# Patient Record
Sex: Male | Born: 1965
Health system: Southern US, Community
[De-identification: ages and names within clinical notes are randomized; demographics above are authoritative.]

## PROBLEM LIST (undated history)

## (undated) DIAGNOSIS — D75A Glucose-6-phosphate dehydrogenase (G6PD) deficiency without anemia: Secondary | ICD-10-CM

## (undated) HISTORY — PX: NECK SURGERY: SHX720

---

## 1998-08-13 ENCOUNTER — Emergency Department (HOSPITAL_COMMUNITY): Admission: EM | Admit: 1998-08-13 | Discharge: 1998-08-13 | Payer: Self-pay | Admitting: Emergency Medicine

## 1999-09-17 ENCOUNTER — Emergency Department (HOSPITAL_COMMUNITY): Admission: EM | Admit: 1999-09-17 | Discharge: 1999-09-17 | Payer: Self-pay | Admitting: Emergency Medicine

## 1999-09-20 ENCOUNTER — Ambulatory Visit (HOSPITAL_COMMUNITY): Admission: RE | Admit: 1999-09-20 | Discharge: 1999-09-20 | Payer: Self-pay | Admitting: Family Medicine

## 1999-09-20 ENCOUNTER — Encounter: Payer: Self-pay | Admitting: Family Medicine

## 1999-10-15 ENCOUNTER — Emergency Department (HOSPITAL_COMMUNITY): Admission: EM | Admit: 1999-10-15 | Discharge: 1999-10-16 | Payer: Self-pay | Admitting: Emergency Medicine

## 2000-11-09 ENCOUNTER — Encounter: Payer: Self-pay | Admitting: Internal Medicine

## 2000-11-09 ENCOUNTER — Emergency Department (HOSPITAL_COMMUNITY): Admission: EM | Admit: 2000-11-09 | Discharge: 2000-11-09 | Payer: Self-pay | Admitting: Internal Medicine

## 2009-09-29 ENCOUNTER — Emergency Department (HOSPITAL_COMMUNITY): Admission: EM | Admit: 2009-09-29 | Discharge: 2009-09-29 | Payer: Self-pay | Admitting: Emergency Medicine

## 2014-02-17 ENCOUNTER — Emergency Department (HOSPITAL_COMMUNITY): Payer: Self-pay

## 2014-02-17 ENCOUNTER — Emergency Department (HOSPITAL_COMMUNITY)
Admission: EM | Admit: 2014-02-17 | Discharge: 2014-02-17 | Disposition: A | Discharge status: Home / Self Care | Payer: Self-pay | Attending: Emergency Medicine | Admitting: Emergency Medicine

## 2014-02-17 ENCOUNTER — Encounter (HOSPITAL_COMMUNITY): Payer: Self-pay | Admitting: Emergency Medicine

## 2014-02-17 DIAGNOSIS — F17200 Nicotine dependence, unspecified, uncomplicated: Secondary | ICD-10-CM | POA: Insufficient documentation

## 2014-02-17 DIAGNOSIS — R05 Cough: Secondary | ICD-10-CM

## 2014-02-17 DIAGNOSIS — J8410 Pulmonary fibrosis, unspecified: Secondary | ICD-10-CM

## 2014-02-17 DIAGNOSIS — J984 Other disorders of lung: Secondary | ICD-10-CM | POA: Insufficient documentation

## 2014-02-17 DIAGNOSIS — Z862 Personal history of diseases of the blood and blood-forming organs and certain disorders involving the immune mechanism: Secondary | ICD-10-CM | POA: Insufficient documentation

## 2014-02-17 DIAGNOSIS — Z8639 Personal history of other endocrine, nutritional and metabolic disease: Secondary | ICD-10-CM | POA: Insufficient documentation

## 2014-02-17 DIAGNOSIS — R059 Cough, unspecified: Secondary | ICD-10-CM | POA: Insufficient documentation

## 2014-02-17 HISTORY — DX: Glucose-6-phosphate dehydrogenase (G6PD) deficiency without anemia: D75.A

## 2014-02-17 MED ORDER — IPRATROPIUM BROMIDE 0.02 % IN SOLN
0.5000 mg | Freq: Once | RESPIRATORY_TRACT | Status: AC
  Administered 2014-02-17: 0.5 mg via RESPIRATORY_TRACT
  Filled 2014-02-17: qty 2.5

## 2014-02-17 MED ORDER — BENZONATATE 100 MG PO CAPS: 100.0000 mg | ORAL_CAPSULE | Freq: Three times a day (TID) | ORAL | Status: DC

## 2014-02-17 MED ORDER — ALBUTEROL SULFATE HFA 108 (90 BASE) MCG/ACT IN AERS: 1.0000 | INHALATION_SPRAY | Freq: Four times a day (QID) | RESPIRATORY_TRACT | Status: DC | PRN

## 2014-02-17 MED ORDER — BENZONATATE 100 MG PO CAPS
100.0000 mg | ORAL_CAPSULE | Freq: Once | ORAL | Status: AC
  Administered 2014-02-17: 100 mg via ORAL
  Filled 2014-02-17: qty 1

## 2014-02-17 MED ORDER — ALBUTEROL SULFATE (2.5 MG/3ML) 0.083% IN NEBU
5.0000 mg | INHALATION_SOLUTION | Freq: Once | RESPIRATORY_TRACT | Status: AC
  Administered 2014-02-17: 5 mg via RESPIRATORY_TRACT
  Filled 2014-02-17: qty 6

## 2014-02-17 MED ORDER — IBUPROFEN 200 MG PO TABS
600.0000 mg | ORAL_TABLET | Freq: Once | ORAL | Status: AC
  Administered 2014-02-17: 600 mg via ORAL
  Filled 2014-02-17: qty 3

## 2014-02-17 NOTE — ED Provider Notes (Signed)
CSN: [id number]     Arrival date & time 02/17/14  1355 History   First MD Initiated Contact with Patient 02/17/14 1711     Chief Complaint  Patient presents with  . cold symptoms      (Consider location/radiation/quality/duration/timing/severity/associated sxs/prior Treatment) HPI Comments: Alan Walsh is a 48 y.o. male who complains of dry cough and productive cough for 30 days. He denies a history of chest pain, chills, fatigue, fevers, nausea, shortness of breath and sweats and has a history of asthma. Patient admits to smoke cigarettes. Pt denies any sick exposure. States that his sx started with a URI. Pt does state that he has some muscle aches, and 1 episode of sweats. Pt also has had post tussive emesis.  The history is provided by the patient.    Past Medical History  Diagnosis Date  . G-6-PD deficiency    Past Surgical History  Procedure Laterality Date  . Neck surgery     No family history on file. History  Substance Use Topics  . Smoking status: Current Every Day Smoker  . Smokeless tobacco: Not on file  . Alcohol Use: No    Review of Systems  Constitutional: Negative for fever and chills.  HENT: Negative for postnasal drip, rhinorrhea and sore throat.   Respiratory: Positive for cough. Negative for shortness of breath and wheezing.   Cardiovascular: Positive for chest pain.      Allergies  Aspirin  Home Medications   Prior to Admission medications   Medication Sig Start Date End Date Taking? Authorizing Provider  ibuprofen (ADVIL,MOTRIN) 200 MG tablet Take 200 mg by mouth every 6 (six) hours as needed (pain.).   Yes Historical Provider, MD  Menthol (HALLS COUGH DROPS) 5.8 MG LOZG Use as directed in the mouth or throat.   Yes Historical Provider, MD  Pheniramine-PE-APAP (THERAFLU FLU & SORE THROAT) 20-10-650 MG PACK Take by mouth.   Yes Historical Provider, MD   BP 114/84  Pulse 71  Temp(Src) 98.5 F (36.9 C) (Oral)  Resp 20  SpO2 95% Physical  Exam  Nursing note and vitals reviewed. Constitutional: He appears well-developed.  HENT:  Head: Atraumatic.  Eyes: Conjunctivae are normal.  Neck: Neck supple.  Cardiovascular: Normal rate.   Pulmonary/Chest: Effort normal. No respiratory distress. He has no wheezes. He has no rales. He exhibits no tenderness.  Chest sounded a bit tight with air movement    ED Course  Procedures (including critical care time) Labs Review Labs Reviewed - No data to display  Imaging Review Dg Chest 2 View  02/17/2014   CLINICAL DATA:  Chest pain  EXAM: CHEST  2 VIEW  COMPARISON:  None.  FINDINGS: Heart size upper normal. Negative for heart failure. Numerous calcified granulomata are seen throughout the lungs. Most of these are very small .  Negative for heart failure.  Negative for pneumonia or effusion.  IMPRESSION: Numerous small calcified granulomata throughout the lungs.  No superimposed acute abnormality.   Electronically Signed   By: Marlan Palauharles  Clark M.D.   On: 02/17/2014 18:14     EKG Interpretation None      Date: 02/17/2014  Rate: 63  Rhythm: normal sinus rhythm  QRS Axis: normal  Intervals: normal  ST/T Wave abnormalities: normal  Conduction Disutrbances: none  Narrative Interpretation: unremarkable     MDM   Final diagnoses:  Granulomatous lung disease  Cough    Pt comes in with cc of cough. Cough x 1 month. No exposure to  sick patients or any child with whooping cough that he is aware of. Pt is a smoker. Lung exam was a slightly tight - so breathing tx given, but no rhonchi, rales or wheezing. No risk for pertusses. Symptom management to be provided. Results of the xrays discussed, and i have asked patient to see [facility].  Derwood Kaplan, MD 02/17/14 1900

## 2014-02-17 NOTE — ED Notes (Signed)
Pt co cold like sx x 1 month, now states he is coughing up a lot of sputum and is having body aches as well.

## 2014-02-17 NOTE — Discharge Instructions (Signed)
We saw you in the ER for the cough. All the results in the ER are normal.  The workup in the ER is not complete, and is limited to screening for life threatening and emergent conditions only, so please see a primary care doctor for further evaluation.   Cough, Adult  A cough is a reflex that helps clear your throat and airways. It can help heal the body or may be a reaction to an irritated airway. A cough may only last 2 or 3 weeks (acute) or may last more than 8 weeks (chronic).  CAUSES Acute cough:  Viral or bacterial infections. Chronic cough:  Infections.  Allergies.  Asthma.  Post-nasal drip.  Smoking.  Heartburn or acid reflux.  Some medicines.  Chronic lung problems (COPD).  Cancer. SYMPTOMS   Cough.  Fever.  Chest pain.  Increased breathing rate.  High-pitched whistling sound when breathing (wheezing).  Colored mucus that you cough up (sputum). TREATMENT   A bacterial cough may be treated with antibiotic medicine.  A viral cough must run its course and will not respond to antibiotics.  Your caregiver may recommend other treatments if you have a chronic cough. HOME CARE INSTRUCTIONS   Only take over-the-counter or prescription medicines for pain, discomfort, or fever as directed by your caregiver. Use cough suppressants only as directed by your caregiver.  Use a cold steam vaporizer or humidifier in your bedroom or home to help loosen secretions.  Sleep in a semi-upright position if your cough is worse at night.  Rest as needed.  Stop smoking if you smoke. SEEK IMMEDIATE MEDICAL CARE IF:   You have pus in your sputum.  Your cough starts to worsen.  You cannot control your cough with suppressants and are losing sleep.  You begin coughing up blood.  You have difficulty breathing.  You develop pain which is getting worse or is uncontrolled with medicine.  You have a fever. MAKE SURE YOU:   Understand these instructions.  Will watch  your condition.  Will get help right away if you are not doing well or get worse. Document Released: 12/31/2010 Document Revised: 09/26/2011 Document Reviewed: 12/31/2010 [facility] Patient Information 2015 [place], [place]. This information is not intended to replace advice given to you by your health care provider. Make sure you discuss any questions you have with your health care provider.   RESOURCE GUIDE  Chronic Pain Problems: Contact [facility]  [phone] Patients need to be referred by their primary care doctor.  Insufficient Money for Medicine: Contact [employer] Way:  call "211."   No Primary Care Doctor: - Call Health Connect  [phone] - can help you locate a primary care doctor that  accepts your insurance, provides certain services, etc. - Physician Referral Service- [phone]  Agencies that provide inexpensive medical care: - [facility]  [phone] - [facility]  [phone] - [facility]  [phone] - Women's Clinic  [phone] - Planned Parenthood  [phone] [facility]  [phone]  Medicaid-accepting [facility] Providers: - [facility]- [address], [address]  [phone], Mon-Fri 9am-7pm, Sat 9am-1pm - [facility]- [address] [place], [address] [place]  [phone] - [facility]- [address], [address] [place]  [phone] [facility]- [address]  [phone] - Renaye Rakers- [address] [place], [address], [phone]  Only accepts [place] patients after they have their  name  applied to their card  Self Pay (no insurance) in [facility] [place]: - Sickle Cell Patients: Dr Willey BladeEric Dean, [facility]  [address], [phone] - [facility]- [address]  [phone]       Patrcia Dolly-     [facility] [place]- [address]       -     [facility]-  see information above (Speak to [employer]am H if you do not have insurance)       -  [facility] [place]- [address],  [phone]       -  [facility]- [address] [place] [address], [phone]       -  Dr Julio Sickssei-Bonsu-  [address], [address], [place], [phone]       -  [facility] - [address], [phone]       -  [facility] [place]- [address] [place] [address], [phone], also [address], [phone]       -    [facility]- [address] [place], [phone], 1st & 3rd Saturday        every month, 10am-1pm  [facility] [address] [place], [place] [address] [phone]  [facility] [address] [place], [place] [address] [phone]  1) Find a Doctor and Pay Out of Pocket Although you won't have to find out who is covered by your insurance plan, it is a good idea to ask around and get recommendations. You will then need to call the office and see if the doctor you have chosen will accept you as a new patient and what types of options they offer for patients who are self-pay. Some doctors offer discounts or will set up payment plans for their patients who do not have insurance, but you will need to ask so you aren't surprised when you get to your appointment.  2) Contact Your Local Health Department Not all health departments have doctors that can see patients for sick visits, but many do, so it is worth a call to see if yours does. If you don't know where your local health department is, you can check in your phone book. The CDC also has a tool to help you locate your state's health department, and many state websites also have listings of all of their local health departments.  3) Find a Walk-in Clinic If your illness is not likely to be very severe or complicated, you may want to try a walk in clinic. These are popping up all over the country in pharmacies, drugstores, and shopping centers. They're  usually staffed by nurse practitioners or physician assistants that have been trained to treat common illnesses and complaints. They're usually fairly quick and inexpensive. However, if you have serious medical issues or chronic medical problems, these are probably not your best option  STD Testing - [facility] Department of [facility] [place], STD Clinic, [address], [place], phone [phone] or [phone].  Monday - Friday, call for an appointment. [facility]- [place] Department of [facility] [place], STD Clinic, [place], [place], phone [phone] or [phone].  Monday - Friday, call for an appointment.  Abuse/Neglect: [facility]- [place] Child Abuse Hotline [phone] [phone] [place]- [place] Child Abuse Hotline [phone] (After Hours)  Emergency Shelter:  [facility] [phone]) [phone]  Maternity Homes: - [facility] [phone] - [facility] [phone]  MRSA Hotline #:   734-851-1218  Dental Assistance If unable to pay or uninsured, contact:  [facility] to become qualified for the adult dental clinic.  Patients with Medicaid: [facility] [phone] [address], [phone] [address] [phone]  If unable to pay, or uninsured, contact [facility] [phone] in [place], [phone] in [facility]) to become qualified for the adult dental clinic  [facility] [address] [place], [place] [address] [phone] [email or web address]  Other Proofreader Services: - [facility]- [address] [place], [place], [place], [address], [phone], Ext. [phone], 2nd and 4th Thursday of the month at 6:30am.  10 clients each day by appointment, can sometimes see walk-in patients if someone does not show for an appointment. [facility]- [address] [place], [place], [address], [phone] - [facility]- [address] [place], [place], [address], [phone] - [place] [facility]- [phone] [facility] Health Department- [phone] [facility]- [phone]

## 2015-10-21 DIAGNOSIS — M4802 Spinal stenosis, cervical region: Secondary | ICD-10-CM | POA: Diagnosis not present

## 2015-10-21 DIAGNOSIS — Z6831 Body mass index (BMI) 31.0-31.9, adult: Secondary | ICD-10-CM | POA: Diagnosis not present

## 2015-12-22 DIAGNOSIS — M5416 Radiculopathy, lumbar region: Secondary | ICD-10-CM | POA: Diagnosis not present

## 2015-12-22 DIAGNOSIS — M4802 Spinal stenosis, cervical region: Secondary | ICD-10-CM | POA: Diagnosis not present

## 2016-01-27 DIAGNOSIS — M4802 Spinal stenosis, cervical region: Secondary | ICD-10-CM | POA: Diagnosis not present

## 2016-01-27 DIAGNOSIS — M5126 Other intervertebral disc displacement, lumbar region: Secondary | ICD-10-CM | POA: Diagnosis not present

## 2016-01-27 DIAGNOSIS — M5416 Radiculopathy, lumbar region: Secondary | ICD-10-CM | POA: Diagnosis not present

## 2016-03-24 DIAGNOSIS — R350 Frequency of micturition: Secondary | ICD-10-CM | POA: Diagnosis not present

## 2016-03-24 DIAGNOSIS — M4802 Spinal stenosis, cervical region: Secondary | ICD-10-CM | POA: Diagnosis not present

## 2016-03-24 DIAGNOSIS — Z720 Tobacco use: Secondary | ICD-10-CM | POA: Diagnosis not present

## 2016-03-24 DIAGNOSIS — E782 Mixed hyperlipidemia: Secondary | ICD-10-CM | POA: Diagnosis not present

## 2016-04-27 DIAGNOSIS — M4802 Spinal stenosis, cervical region: Secondary | ICD-10-CM | POA: Diagnosis not present

## 2016-04-27 DIAGNOSIS — M5416 Radiculopathy, lumbar region: Secondary | ICD-10-CM | POA: Diagnosis not present

## 2016-04-27 DIAGNOSIS — M5126 Other intervertebral disc displacement, lumbar region: Secondary | ICD-10-CM | POA: Diagnosis not present

## 2016-04-27 DIAGNOSIS — M542 Cervicalgia: Secondary | ICD-10-CM | POA: Diagnosis not present

## 2016-06-23 DIAGNOSIS — M542 Cervicalgia: Secondary | ICD-10-CM | POA: Diagnosis not present

## 2016-06-23 DIAGNOSIS — M4802 Spinal stenosis, cervical region: Secondary | ICD-10-CM | POA: Diagnosis not present

## 2016-07-28 DIAGNOSIS — M4802 Spinal stenosis, cervical region: Secondary | ICD-10-CM | POA: Diagnosis not present

## 2016-07-28 DIAGNOSIS — R030 Elevated blood-pressure reading, without diagnosis of hypertension: Secondary | ICD-10-CM | POA: Diagnosis not present

## 2016-07-28 DIAGNOSIS — M5416 Radiculopathy, lumbar region: Secondary | ICD-10-CM | POA: Diagnosis not present

## 2016-07-28 DIAGNOSIS — M542 Cervicalgia: Secondary | ICD-10-CM | POA: Diagnosis not present

## 2016-09-22 DIAGNOSIS — M542 Cervicalgia: Secondary | ICD-10-CM | POA: Diagnosis not present

## 2016-09-22 DIAGNOSIS — M5416 Radiculopathy, lumbar region: Secondary | ICD-10-CM | POA: Diagnosis not present

## 2016-11-23 DIAGNOSIS — M4802 Spinal stenosis, cervical region: Secondary | ICD-10-CM | POA: Diagnosis not present

## 2016-11-23 DIAGNOSIS — M542 Cervicalgia: Secondary | ICD-10-CM | POA: Diagnosis not present

## 2016-11-23 DIAGNOSIS — M5416 Radiculopathy, lumbar region: Secondary | ICD-10-CM | POA: Diagnosis not present

## 2016-11-23 DIAGNOSIS — R030 Elevated blood-pressure reading, without diagnosis of hypertension: Secondary | ICD-10-CM | POA: Diagnosis not present

## 2016-12-15 DIAGNOSIS — M5416 Radiculopathy, lumbar region: Secondary | ICD-10-CM | POA: Diagnosis not present

## 2017-01-12 DIAGNOSIS — M5416 Radiculopathy, lumbar region: Secondary | ICD-10-CM | POA: Diagnosis not present

## 2017-01-12 DIAGNOSIS — R030 Elevated blood-pressure reading, without diagnosis of hypertension: Secondary | ICD-10-CM | POA: Diagnosis not present

## 2017-01-12 DIAGNOSIS — M542 Cervicalgia: Secondary | ICD-10-CM | POA: Diagnosis not present

## 2017-01-12 DIAGNOSIS — M4802 Spinal stenosis, cervical region: Secondary | ICD-10-CM | POA: Diagnosis not present

## 2017-02-01 DIAGNOSIS — Z125 Encounter for screening for malignant neoplasm of prostate: Secondary | ICD-10-CM | POA: Diagnosis not present

## 2017-02-01 DIAGNOSIS — M545 Low back pain: Secondary | ICD-10-CM | POA: Diagnosis not present

## 2017-02-01 DIAGNOSIS — M542 Cervicalgia: Secondary | ICD-10-CM | POA: Diagnosis not present

## 2017-02-01 DIAGNOSIS — R202 Paresthesia of skin: Secondary | ICD-10-CM | POA: Diagnosis not present

## 2017-02-01 DIAGNOSIS — Z0000 Encounter for general adult medical examination without abnormal findings: Secondary | ICD-10-CM | POA: Diagnosis not present

## 2017-02-02 DIAGNOSIS — M4802 Spinal stenosis, cervical region: Secondary | ICD-10-CM | POA: Diagnosis not present

## 2017-03-29 DIAGNOSIS — M542 Cervicalgia: Secondary | ICD-10-CM | POA: Diagnosis not present

## 2017-03-29 DIAGNOSIS — M5416 Radiculopathy, lumbar region: Secondary | ICD-10-CM | POA: Diagnosis not present

## 2017-03-29 DIAGNOSIS — M5126 Other intervertebral disc displacement, lumbar region: Secondary | ICD-10-CM | POA: Diagnosis not present

## 2017-03-29 DIAGNOSIS — M4802 Spinal stenosis, cervical region: Secondary | ICD-10-CM | POA: Diagnosis not present

## 2017-04-16 ENCOUNTER — Encounter (HOSPITAL_COMMUNITY): Payer: Self-pay | Admitting: Emergency Medicine

## 2017-04-16 ENCOUNTER — Emergency Department (HOSPITAL_COMMUNITY)
Admission: EM | Admit: 2017-04-16 | Discharge: 2017-04-16 | Disposition: A | Discharge status: Home / Self Care | Payer: BLUE CROSS/BLUE SHIELD | Attending: Emergency Medicine | Admitting: Emergency Medicine

## 2017-04-16 DIAGNOSIS — H6001 Abscess of right external ear: Secondary | ICD-10-CM | POA: Insufficient documentation

## 2017-04-16 DIAGNOSIS — L02811 Cutaneous abscess of head [any part, except face]: Secondary | ICD-10-CM

## 2017-04-16 DIAGNOSIS — F17210 Nicotine dependence, cigarettes, uncomplicated: Secondary | ICD-10-CM | POA: Insufficient documentation

## 2017-04-16 MED ORDER — LIDOCAINE-EPINEPHRINE (PF) 2 %-1:200000 IJ SOLN
10.0000 mL | Freq: Once | INTRAMUSCULAR | Status: AC
  Administered 2017-04-16: 10 mL
  Filled 2017-04-16: qty 20

## 2017-04-16 MED ORDER — DOXYCYCLINE HYCLATE 100 MG PO CAPS: 100.0000 mg | ORAL_CAPSULE | Freq: Two times a day (BID) | ORAL | 0 refills | Status: DC

## 2017-04-16 NOTE — ED Triage Notes (Signed)
Patient complaining of lump behind right ear. Patient states it started about two days ago. Patient states he does not know where it come from. Patient states he has tried to bust it. Patient complains it is painful.

## 2017-04-16 NOTE — Discharge Instructions (Signed)
You presented to the ED for evaluation of painful bump to the right posterior ear. This is consistent with an abscess and surrounding cellulitis. Your abscess was incised and drained with small amount of yellow drainage. Given surrounding inflammation, please take antibiotic as prescribed. Additionally take ibuprofen and Tylenol for redness, swelling and pain. Do warm compresses at least twice daily to promote drainage. Follow up with primary care provider within one week for reevaluation and to ensure that the abscess is healing well. Return to the ED for fevers, chills, worsening redness, swelling or pain.

## 2017-04-16 NOTE — ED Provider Notes (Signed)
[facility]-EMERGENCY DEPT Provider Note   CSN: [id number] Arrival date & time: 04/16/17  8657     History   Chief Complaint Chief Complaint  Patient presents with  . Mass    HPI Alan Walsh is a 51 y.o. male who presents to the ED for evaluation of painful bump to behind right ear 2-3 days. States he was outside when he felt something sting him in the back of the ear, unsure of what it was. Since then he's had increasing swelling, pain, redness and yellow drainage from the area. States the pain now is getting worse and feels "deeper". No fevers, chills. No history of IV drug use or diabetes. Has tried ibuprofen and Tylenol with minimal relief.  HPI  Past Medical History:  Diagnosis Date  . G-6-PD deficiency (HCC)     There are no active problems to display for this patient.   Past Surgical History:  Procedure Laterality Date  . NECK SURGERY         Home Medications    Prior to Admission medications   Medication Sig Start Date End Date Taking? Authorizing Provider  albuterol (PROVENTIL HFA;VENTOLIN HFA) 108 (90 BASE) MCG/ACT inhaler Inhale 1-2 puffs into the lungs every 6 (six) hours as needed for wheezing or shortness of breath. 02/17/14   Derwood Kaplan, MD  benzonatate (TESSALON) 100 MG capsule Take 1 capsule (100 mg total) by mouth every 8 (eight) hours. 02/17/14   Derwood Kaplan, MD  doxycycline (VIBRAMYCIN) 100 MG capsule Take 1 capsule (100 mg total) by mouth 2 (two) times daily. 04/16/17   Liberty Handy, PA-C  ibuprofen (ADVIL,MOTRIN) 200 MG tablet Take 200 mg by mouth every 6 (six) hours as needed (pain.).    [provider]  Menthol (HALLS COUGH DROPS) 5.8 MG LOZG Use as directed in the mouth or throat.    [provider]  Pheniramine-PE-APAP (THERAFLU FLU & SORE THROAT) 20-10-650 MG PACK Take by mouth.    [provider]    Family History History reviewed. No pertinent family history.  Social History Social History  Substance  Use Topics  . Smoking status: Current Every Day Smoker  . Smokeless tobacco: Never Used  . Alcohol use No     Allergies   Aspirin   Review of Systems Review of Systems  Constitutional: Negative for chills and fever.  Skin: Positive for color change.     Physical Exam Updated Vital Signs BP 121/89 (BP Location: Left Arm)   Pulse 72   Temp 98.3 F (36.8 C) (Oral)   Resp 18   Ht  (1.854 m)   Wt 106.6 kg (235 lb)   SpO2 99%   BMI 31.00 kg/m   Physical Exam  Constitutional: He is oriented to person, place, and time. He appears well-developed and well-nourished. No distress.  NAD.  HENT:  Head: Normocephalic and atraumatic.  Right Ear: External ear normal.  Left Ear: External ear normal.  Nose: Nose normal.  TMs, canals and external ears normal bilaterally No middle ear effusion bilaterally No pain to mastoid bilaterally No pain with movement of the external ear  Eyes: Conjunctivae and EOM are normal. No scleral icterus.  Neck: Normal range of motion. Neck supple.  Full PROM of neck with pain   Cardiovascular: Normal rate, regular rhythm, normal heart sounds and intact distal pulses.   No murmur heard. Pulmonary/Chest: Effort normal and breath sounds normal. He has no wheezes.  Musculoskeletal: Normal range of motion. He exhibits  no deformity.  Neurological: He is alert and oriented to person, place, and time.  Skin: Skin is warm and dry. Capillary refill takes less than 2 seconds.  2 cm x 2 cm area of fluctuance, erythema, tenderness and edema to area above right mastoid.  Psychiatric: He has a normal mood and affect. His behavior is normal. Judgment and thought content normal.  Nursing note and vitals reviewed.    ED Treatments / Results  Labs (all labs ordered are listed, but only abnormal results are displayed) Labs Reviewed - No data to display  EKG  EKG Interpretation None       Radiology No results found.  Procedures Procedures  (including critical care time)  Medications Ordered in ED Medications  lidocaine-EPINEPHrine (XYLOCAINE W/EPI) 2 %-1:200000 (PF) injection 10 mL (not administered)     Initial Impression / Assessment and Plan / ED Course  I have reviewed the triage vital signs and the nursing notes.  Pertinent labs & imaging results that were available during my care of the patient were reviewed by me and considered in my medical decision making (see chart for details).     51 y.o. yo male with abscess to is posterior right ear, above mastoid. No associated fever. I&D performed in ED with scant amount of purulent discharge.  Given surrounding cellulitis will d/c with antibiotics. Will discharge with warm compresses, NSAIDs and I&D care instructions. Patient aware of symptoms that would warrant return to ED for re-evaluation and treatment. Patient verbalized understanding and is agreeable with plan. He has PCP appointment next week who will reassess abscess.    Final Clinical Impressions(s) / ED Diagnoses   Final diagnoses:  Cutaneous abscess of head excluding face    New Prescriptions New Prescriptions   DOXYCYCLINE (VIBRAMYCIN) 100 MG CAPSULE    Take 1 capsule (100 mg total) by mouth 2 (two) times daily.     Liberty Handy, PA-C 04/16/17 1054    Lorre Nick, MD 04/17/17 [phone]

## 2017-05-04 DIAGNOSIS — M5416 Radiculopathy, lumbar region: Secondary | ICD-10-CM | POA: Diagnosis not present

## 2017-05-04 DIAGNOSIS — M5126 Other intervertebral disc displacement, lumbar region: Secondary | ICD-10-CM | POA: Diagnosis not present

## 2017-06-23 DIAGNOSIS — M4802 Spinal stenosis, cervical region: Secondary | ICD-10-CM | POA: Diagnosis not present

## 2017-06-23 DIAGNOSIS — M5416 Radiculopathy, lumbar region: Secondary | ICD-10-CM | POA: Diagnosis not present

## 2017-06-23 DIAGNOSIS — M5126 Other intervertebral disc displacement, lumbar region: Secondary | ICD-10-CM | POA: Diagnosis not present

## 2017-06-23 DIAGNOSIS — I10 Essential (primary) hypertension: Secondary | ICD-10-CM | POA: Diagnosis not present

## 2017-08-31 DIAGNOSIS — R42 Dizziness and giddiness: Secondary | ICD-10-CM | POA: Diagnosis not present

## 2017-08-31 DIAGNOSIS — M4802 Spinal stenosis, cervical region: Secondary | ICD-10-CM | POA: Diagnosis not present

## 2017-10-05 DIAGNOSIS — M5416 Radiculopathy, lumbar region: Secondary | ICD-10-CM | POA: Diagnosis not present

## 2017-10-05 DIAGNOSIS — M5126 Other intervertebral disc displacement, lumbar region: Secondary | ICD-10-CM | POA: Diagnosis not present

## 2017-10-05 DIAGNOSIS — I10 Essential (primary) hypertension: Secondary | ICD-10-CM | POA: Diagnosis not present

## 2017-10-05 DIAGNOSIS — M4802 Spinal stenosis, cervical region: Secondary | ICD-10-CM | POA: Diagnosis not present

## 2017-10-18 DIAGNOSIS — M1711 Unilateral primary osteoarthritis, right knee: Secondary | ICD-10-CM | POA: Diagnosis not present

## 2017-11-30 DIAGNOSIS — M5416 Radiculopathy, lumbar region: Secondary | ICD-10-CM | POA: Diagnosis not present

## 2018-01-04 DIAGNOSIS — M5126 Other intervertebral disc displacement, lumbar region: Secondary | ICD-10-CM | POA: Diagnosis not present

## 2018-01-04 DIAGNOSIS — Z6832 Body mass index (BMI) 32.0-32.9, adult: Secondary | ICD-10-CM | POA: Diagnosis not present

## 2018-01-04 DIAGNOSIS — M5416 Radiculopathy, lumbar region: Secondary | ICD-10-CM | POA: Diagnosis not present

## 2018-01-04 DIAGNOSIS — M542 Cervicalgia: Secondary | ICD-10-CM | POA: Diagnosis not present

## 2018-02-16 DIAGNOSIS — Z1212 Encounter for screening for malignant neoplasm of rectum: Secondary | ICD-10-CM | POA: Diagnosis not present

## 2018-02-16 DIAGNOSIS — Z125 Encounter for screening for malignant neoplasm of prostate: Secondary | ICD-10-CM | POA: Diagnosis not present

## 2018-02-16 DIAGNOSIS — Z0000 Encounter for general adult medical examination without abnormal findings: Secondary | ICD-10-CM | POA: Diagnosis not present

## 2018-02-16 DIAGNOSIS — M542 Cervicalgia: Secondary | ICD-10-CM | POA: Diagnosis not present

## 2018-02-16 DIAGNOSIS — E782 Mixed hyperlipidemia: Secondary | ICD-10-CM | POA: Diagnosis not present

## 2018-02-16 DIAGNOSIS — R51 Headache: Secondary | ICD-10-CM | POA: Diagnosis not present

## 2018-04-10 DIAGNOSIS — M5416 Radiculopathy, lumbar region: Secondary | ICD-10-CM | POA: Diagnosis not present

## 2018-05-15 ENCOUNTER — Telehealth: Payer: Self-pay

## 2018-05-15 NOTE — Telephone Encounter (Signed)
Patient had me review his labs from 02/26/18 you stated that his liver functions were slightly elevated and if he was taking increased amounts of NSAID ? He was a little confused as to where this could be coming up high because he isnt taking anything different besides his routine meds. Doesn't drink and takes plenty of water. PA

## 2018-05-16 NOTE — Telephone Encounter (Signed)
Let him know it can also come from eating increased amounts of processed foods.  He needs to continue to avoid Ibuprofen/aleve, processed foods and we can recheck in November, he may need to make an appt.

## 2018-05-21 NOTE — Telephone Encounter (Signed)
[place]

## 2018-06-05 DIAGNOSIS — R030 Elevated blood-pressure reading, without diagnosis of hypertension: Secondary | ICD-10-CM | POA: Diagnosis not present

## 2018-06-05 DIAGNOSIS — M5126 Other intervertebral disc displacement, lumbar region: Secondary | ICD-10-CM | POA: Diagnosis not present

## 2018-06-05 DIAGNOSIS — M5416 Radiculopathy, lumbar region: Secondary | ICD-10-CM | POA: Diagnosis not present

## 2018-06-05 DIAGNOSIS — M542 Cervicalgia: Secondary | ICD-10-CM | POA: Diagnosis not present

## 2018-07-23 DIAGNOSIS — M4802 Spinal stenosis, cervical region: Secondary | ICD-10-CM | POA: Diagnosis not present

## 2018-08-20 ENCOUNTER — Ambulatory Visit: Payer: Self-pay | Admitting: Nurse Practitioner

## 2018-09-05 ENCOUNTER — Encounter: Payer: Self-pay | Admitting: Nurse Practitioner

## 2018-09-05 ENCOUNTER — Emergency Department (HOSPITAL_COMMUNITY): Payer: BLUE CROSS/BLUE SHIELD

## 2018-09-05 ENCOUNTER — Ambulatory Visit (INDEPENDENT_AMBULATORY_CARE_PROVIDER_SITE_OTHER): Payer: BLUE CROSS/BLUE SHIELD | Admitting: Nurse Practitioner

## 2018-09-05 ENCOUNTER — Emergency Department (HOSPITAL_COMMUNITY)
Admission: EM | Admit: 2018-09-05 | Discharge: 2018-09-05 | Disposition: A | Discharge status: Home / Self Care | Payer: BLUE CROSS/BLUE SHIELD | Attending: Emergency Medicine | Admitting: Emergency Medicine

## 2018-09-05 ENCOUNTER — Encounter (HOSPITAL_COMMUNITY): Payer: Self-pay | Admitting: Emergency Medicine

## 2018-09-05 ENCOUNTER — Other Ambulatory Visit: Payer: Self-pay

## 2018-09-05 VITALS — BP 110/62 | HR 88 | Temp 99.0°F | Ht 73.25 in | Wt 220.0 lb

## 2018-09-05 DIAGNOSIS — R519 Headache, unspecified: Secondary | ICD-10-CM

## 2018-09-05 DIAGNOSIS — R5081 Fever presenting with conditions classified elsewhere: Secondary | ICD-10-CM | POA: Diagnosis not present

## 2018-09-05 DIAGNOSIS — J111 Influenza due to unidentified influenza virus with other respiratory manifestations: Secondary | ICD-10-CM | POA: Diagnosis not present

## 2018-09-05 DIAGNOSIS — R51 Headache: Secondary | ICD-10-CM

## 2018-09-05 DIAGNOSIS — R1084 Generalized abdominal pain: Secondary | ICD-10-CM | POA: Diagnosis not present

## 2018-09-05 DIAGNOSIS — R079 Chest pain, unspecified: Secondary | ICD-10-CM | POA: Diagnosis not present

## 2018-09-05 DIAGNOSIS — R6889 Other general symptoms and signs: Secondary | ICD-10-CM | POA: Diagnosis not present

## 2018-09-05 DIAGNOSIS — Z79899 Other long term (current) drug therapy: Secondary | ICD-10-CM | POA: Insufficient documentation

## 2018-09-05 DIAGNOSIS — J189 Pneumonia, unspecified organism: Secondary | ICD-10-CM

## 2018-09-05 DIAGNOSIS — F17200 Nicotine dependence, unspecified, uncomplicated: Secondary | ICD-10-CM | POA: Insufficient documentation

## 2018-09-05 DIAGNOSIS — R0602 Shortness of breath: Secondary | ICD-10-CM | POA: Diagnosis not present

## 2018-09-05 DIAGNOSIS — J181 Lobar pneumonia, unspecified organism: Secondary | ICD-10-CM | POA: Insufficient documentation

## 2018-09-05 DIAGNOSIS — R05 Cough: Secondary | ICD-10-CM | POA: Diagnosis not present

## 2018-09-05 LAB — POCT URINALYSIS DIPSTICK
Glucose, UA: NEGATIVE
KETONES UA: NEGATIVE
Leukocytes, UA: NEGATIVE
Nitrite, UA: NEGATIVE
PH UA: 7 (ref 5.0–8.0)
Protein, UA: POSITIVE — AB
RBC UA: NEGATIVE
Spec Grav, UA: 1.02 (ref 1.010–1.025)
Urobilinogen, UA: 1 E.U./dL

## 2018-09-05 MED ORDER — AZITHROMYCIN 250 MG PO TABS: 250.0000 mg | ORAL_TABLET | Freq: Every day | ORAL | 0 refills | Status: AC

## 2018-09-05 MED ORDER — AZITHROMYCIN 250 MG PO TABS: 250.0000 mg | ORAL_TABLET | Freq: Every day | ORAL | 0 refills | Status: DC

## 2018-09-05 MED ORDER — AMOXICILLIN-POT CLAVULANATE 875-125 MG PO TABS: 1.0000 | ORAL_TABLET | Freq: Two times a day (BID) | ORAL | 0 refills | Status: DC

## 2018-09-05 MED ORDER — AMOXICILLIN-POT CLAVULANATE 875-125 MG PO TABS
1.0000 | ORAL_TABLET | Freq: Once | ORAL | Status: AC
  Administered 2018-09-05: 1 via ORAL
  Filled 2018-09-05: qty 1

## 2018-09-05 MED ORDER — AZITHROMYCIN 250 MG PO TABS
500.0000 mg | ORAL_TABLET | Freq: Once | ORAL | Status: AC
  Administered 2018-09-05: 500 mg via ORAL
  Filled 2018-09-05: qty 2

## 2018-09-05 MED ORDER — SODIUM CHLORIDE 0.9 % IV BOLUS
1000.0000 mL | Freq: Once | INTRAVENOUS | Status: AC
Start: 2018-09-05 — End: 2018-09-05
  Administered 2018-09-05: 1000 mL via INTRAVENOUS

## 2018-09-05 MED ORDER — CEFTRIAXONE SODIUM 1 G IJ SOLR
1.0000 g | Freq: Once | INTRAMUSCULAR | Status: AC
  Administered 2018-09-05: 1 g via INTRAMUSCULAR

## 2018-09-05 NOTE — ED Provider Notes (Addendum)
[facility] EMERGENCY DEPARTMENT Provider Note   CSN: [id number] Arrival date & time: 09/05/18  1049    History   Chief Complaint Chief Complaint  Patient presents with  . Influenza    HPI Alan Walsh is a 53 y.o. male.     HPI Patient presents with concern of flulike illness. Illness began about 1 week ago, with cough, generalized discomfort, nausea. During the interval the patient has been using OTC medication without substantial relief. He has had several episodes of vomiting. He has no focal pain including chest or abdominal pain, is diffuse soreness. Today the patient went to his physician's office after his generalized weakness, overall myalgia became worse while showering. He was diagnosed with influenza, reportedly, sent here for concern of dehydration. Patient is a smoker. He presents with his mother who assists with the HPI. Past Medical History:  Diagnosis Date  . 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   Past Surgical History:  Procedure Laterality Date  . NECK SURGERY          Home Medications    Prior to Admission medications   Medication Sig Start Date End Date Taking? Authorizing Provider  cyclobenzaprine (FEXMID) 7.5 MG tablet Take 7.5 mg by mouth 3 (three) times daily as needed for muscle spasms.    [provider]  gabapentin (NEURONTIN) 100 MG capsule Take 100 mg by mouth 3 (three) times daily. Take 3 tablets daily as needed for pain    [provider]  HYDROcodone-acetaminophen (NORCO/VICODIN) 5-325 MG tablet Take 1 tablet by mouth every 6 (six) hours as needed for moderate pain.    [provider]  topiramate (TOPAMAX) 50 MG tablet Take 50 mg by mouth 2 (two) times daily. TAKE ONE TABLET IN THE MORNING AND 1 1/2 TAB AT BEDTIME    [provider]    Family History History reviewed. No pertinent family history.  Social History Social History     Tobacco Use  . Smoking status: Current Every Day Smoker  . Smokeless tobacco: Never Used  Substance Use Topics  . Alcohol use: No  . Drug use: No     Allergies   Aspirin   Review of Systems Review of Systems  Constitutional:       Per HPI, otherwise negative  HENT:       Per HPI, otherwise negative  Respiratory:       Per HPI, otherwise negative  Cardiovascular:       Per HPI, otherwise negative  Gastrointestinal: Negative for vomiting.  Endocrine:       Negative aside from HPI  Genitourinary:       Neg aside from HPI   Musculoskeletal:       Per HPI, otherwise negative  Skin: Negative.   Neurological: Negative for syncope.     Physical Exam Updated Vital Signs BP 104/86 (BP Location: Right Arm)   Pulse 78   Temp 98.8 F (37.1 C) (Oral)   Resp 18   Ht 6\' 1"  (1.854 m)   Wt 99.8 kg   SpO2 98%   BMI 29.03 kg/m   Physical Exam Vitals signs and nursing note reviewed.  Constitutional:      General: He is not in acute distress.    Appearance: He is well-developed.  HENT:     Head: Normocephalic and atraumatic.  Eyes:     Conjunctiva/sclera: Conjunctivae normal.  Cardiovascular:  Rate and Rhythm: Normal rate and regular rhythm.  Pulmonary:     Breath sounds: Decreased air movement present. No stridor. Decreased breath sounds present.  Abdominal:     General: There is no distension.  Skin:    General: Skin is warm and dry.  Neurological:     Mental Status: He is alert and oriented to person, place, and time.      ED Treatments / Results  Labs (all labs ordered are listed, but only abnormal results are displayed) Labs Reviewed - No data to display  EKG None  Radiology Dg Chest 2 View  Result Date: 09/05/2018 CLINICAL DATA:  Influenza for 1 week. Chest pain and shortness of breath EXAM: CHEST - 2 VIEW COMPARISON:  02/17/2014 FINDINGS: Scattered tiny calcified pulmonary nodules not appreciably changed from 02/17/2014. Suspected lingular  scarring, chronic. Confluent right basilar density is increased in prominence and although at least partially due to confluent vascular and bony structures, underlying pneumonia or mass in the right lower lobe is not readily excluded. Mild airway thickening. Chronic left fifth rib deformity likely from an old fracture. No blunting of the costophrenic angles. IMPRESSION: 1. Confluent density in the right middle lobe could reflect pneumonia or a pulmonary nodule. Consider chest CT for further workup. 2. Scattered chronic calcified pulmonary nodules likely from old miliary granulomatous disease. 3. Scarring or atelectasis along the lingula. Electronically Signed   By: Gaylyn Rong M.D.   On: 09/05/2018 12:50   Ct Chest Wo Contrast  Result Date: 09/05/2018 CLINICAL DATA:  Shortness of breath, chest pain and flu symptoms. EXAM: CT CHEST WITHOUT CONTRAST TECHNIQUE: Multidetector CT imaging of the chest was performed following the standard protocol without IV contrast. COMPARISON:  Chest radiograph 09/05/2018 and 02/17/2014. FINDINGS: Cardiovascular: Atherosclerotic calcification of the aorta. Heart size normal. No pericardial effusion. Mediastinum/Nodes: Mediastinal lymph nodes are subcentimeter in short axis size. Hilar regions are difficult to evaluate without IV contrast. No axillary adenopathy. Esophagus is grossly unremarkable. Lungs/Pleura: Peribronchovascular nodularity, consolidation and ground-glass are scattered throughout all lobes of both lungs. Minimal associated septal thickening in the right upper lobe. Numerous calcified granulomas. No pleural fluid. Airway is unremarkable. Upper Abdomen: Visualized portions of the liver, gallbladder, adrenal glands, kidneys, spleen, pancreas, stomach and bowel are grossly unremarkable. Musculoskeletal: No worrisome lytic or sclerotic lesions. IMPRESSION: 1. Multilobar peribronchovascular nodularity, consolidation and ground-glass, most indicative of  bronchopneumonia. Consider follow-up CT chest without contrast in 3-4 weeks to ensure resolution and exclude underlying malignancy. 2.  Aortic atherosclerosis (ICD10-170.0). Electronically Signed   By: Leanna Battles M.D.   On: 09/05/2018 14:18    Procedures Procedures (including critical care time)  Medications Ordered in ED Medications  sodium chloride 0.9 % bolus 1,000 mL (1,000 mLs Intravenous New Bag/Given 09/05/18 1258)     Initial Impression / Assessment and Plan / ED Course  I have reviewed the triage vital signs and the nursing notes.  Pertinent labs & imaging results that were available during my care of the patient were reviewed by me and considered in my medical decision making (see chart for details).       Smoking cessation provided, particularly in light of this patient's evaluation in the ED.  On repeat exam the patient is receiving IV fluids, is awake and alert. Initial x-ray nondiagnostic, CT ordered.  2:46 PM Appears better, states that he feels better has received fluid resuscitation. Discussed CT results with the patient and his mother. CT consistent with pneumonia. We had a lengthy  conversation about the need for smoking cessation, initiation of antibiotics, outpatient follow-up. With no hypoxia, no distress, improvement here with fluids, the patient was discharged after initial antibiotics were provided in the emergency department.  Final Clinical Impressions(s) / ED Diagnoses  Influenza Community-acquired pneumonia, right upper lobe   Gerhard MunchLockwood, Cailan General, MD 09/05/18 1447    Gerhard MunchLockwood, Brenae Lasecki, MD 09/05/18 [phone]

## 2018-09-05 NOTE — Patient Instructions (Signed)
Influenza, Adult Influenza is also called "the flu." It is an infection in the lungs, nose, and throat (respiratory tract). It is caused by a virus. The flu causes symptoms that are similar to symptoms of a cold. It also causes a high fever and body aches. The flu spreads easily from person to person (is contagious). Getting a flu shot (influenza vaccination) every year is the best way to prevent the flu. What are the causes? This condition is caused by the influenza virus. You can get the virus by:  Breathing in droplets that are in the air from the cough or sneeze of a person who has the virus.  Touching something that has the virus on it (is contaminated) and then touching your mouth, nose, or eyes. What increases the risk? Certain things may make you more likely to get the flu. These include:  Not washing your hands often.  Having close contact with many people during cold and flu season.  Touching your mouth, eyes, or nose without first washing your hands.  Not getting a flu shot every year. You may have a higher risk for the flu, along with serious problems such as a lung infection (pneumonia), if you:  Are older than 65.  Are pregnant.  Have a weakened disease-fighting system (immune system) because of a disease or taking certain medicines.  Have a long-term (chronic) illness, such as: ? Heart, kidney, or lung disease. ? Diabetes. ? Asthma.  Have a liver disorder.  Are very overweight (morbidly obese).  Have anemia. This is a condition that affects your red blood cells. What are the signs or symptoms? Symptoms usually begin suddenly and last 4-14 days. They may include:  Fever and chills.  Headaches, body aches, or muscle aches.  Sore throat.  Cough.  Runny or stuffy (congested) nose.  Chest discomfort.  Not wanting to eat as much as normal (poor appetite).  Weakness or feeling tired (fatigue).  Dizziness.  Feeling sick to your stomach (nauseous) or  throwing up (vomiting). How is this treated? If the flu is found early, you can be treated with medicine that can help reduce how bad the illness is and how long it lasts (antiviral medicine). This may be given by mouth (orally) or through an IV tube. Taking care of yourself at home can help your symptoms get better. Your doctor may suggest:  Taking over-the-counter medicines.  Drinking plenty of fluids. The flu often goes away on its own. If you have very bad symptoms or other problems, you may be treated in a hospital. Follow these instructions at home:     Activity  Rest as needed. Get plenty of sleep.  Stay home from work or school as told by your doctor. ? Do not leave home until you do not have a fever for 24 hours without taking medicine. ? Leave home only to visit your doctor. Eating and drinking  Take an ORS (oral rehydration solution). This is a drink that is sold at pharmacies and stores.  Drink enough fluid to keep your pee (urine) pale yellow.  Drink clear fluids in small amounts as you are able. Clear fluids include: ? Water. ? Ice chips. ? Fruit juice that has water added (diluted fruit juice). ? Low-calorie sports drinks.  Eat bland, easy-to-digest foods in small amounts as you are able. These foods include: ? Bananas. ? Applesauce. ? Rice. ? Lean meats. ? Toast. ? Crackers.  Do not eat or drink: ? Fluids that have a lot   of sugar or caffeine. ? Alcohol. ? Spicy or fatty foods. General instructions  Take over-the-counter and prescription medicines only as told by your doctor.  Use a cool mist humidifier to add moisture to the air in your home. This can make it easier for you to breathe.  Cover your mouth and nose when you cough or sneeze.  Wash your hands with soap and water often, especially after you cough or sneeze. If you cannot use soap and water, use alcohol-based hand sanitizer.  Keep all follow-up visits as told by your doctor. This is  important. How is this prevented?   Get a flu shot every year. You may get the flu shot in late summer, fall, or winter. Ask your doctor when you should get your flu shot.  Avoid contact with people who are sick during fall and winter (cold and flu season). Contact a doctor if:  You get new symptoms.  You have: ? Chest pain. ? Watery poop (diarrhea). ? A fever.  Your cough gets worse.  You start to have more mucus.  You feel sick to your stomach.  You throw up. Get help right away if you:  Have shortness of breath.  Have trouble breathing.  Have skin or nails that turn a bluish color.  Have very bad pain or stiffness in your neck.  Get a sudden headache.  Get sudden pain in your face or ear.  Cannot eat or drink without throwing up. Summary  Influenza ("the flu") is an infection in the lungs, nose, and throat. It is caused by a virus.  Take over-the-counter and prescription medicines only as told by your doctor.  Getting a flu shot every year is the best way to avoid getting the flu. This information is not intended to replace advice given to you by your health care provider. Make sure you discuss any questions you have with your health care provider. Document Released: 04/12/2008 Document Revised: 12/20/2017 Document Reviewed: 12/20/2017 Elsevier Interactive Patient Education  2019 [employer] Abdominal Pain, Adult  Many things can cause belly (abdominal) pain. Most times, belly pain is not dangerous. Many cases of belly pain can be watched and treated at home. Sometimes belly pain is serious, though. Your doctor will try to find the cause of your belly pain. Follow these instructions at home:  Take over-the-counter and prescription medicines only as told by your doctor. Do not take medicines that help you poop (laxatives) unless told to by your doctor.  Drink enough fluid to keep your pee (urine) clear or pale yellow.  Watch your belly pain for any  changes.  Keep all follow-up visits as told by your doctor. This is important. Contact a doctor if:  Your belly pain changes or gets worse.  You are not hungry, or you lose weight without trying.  You are having trouble pooping (constipated) or have watery poop (diarrhea) for more than 2-3 days.  You have pain when you pee or poop.  Your belly pain wakes you up at night.  Your pain gets worse with meals, after eating, or with certain foods.  You are throwing up and cannot keep anything down.  You have a fever. Get help right away if:  Your pain does not go away as soon as your doctor says it should.  You cannot stop throwing up.  Your pain is only in areas of your belly, such as the right side or the left lower part of the belly.  You have bloody  or black poop, or poop that looks like tar.  You have very bad pain, cramping, or bloating in your belly.  You have signs of not having enough fluid or water in your body (dehydration), such as: ? Dark pee, very little pee, or no pee. ? Cracked lips. ? Dry mouth. ? Sunken eyes. ? Sleepiness. ? Weakness. This information is not intended to replace advice given to you by your health care provider. Make sure you discuss any questions you have with your health care provider. Document Released: 12/21/2007 Document Revised: 01/22/2016 Document Reviewed: 12/16/2015 Elsevier Interactive Patient Education  2019 [employer].

## 2018-09-05 NOTE — Progress Notes (Addendum)
Subjective:     Patient ID: Alan Walsh , male    DOB: 05/17/1966 , 53 y.o.   MRN: [id number]   Chief Complaint  Patient presents with  . URI    patient states his symptoms started on wednesday. patient states he has bodyaches,fatigue,cough, vomiting and abdominal pain    HPI  URI   This is a new problem. The current episode started in the past 7 days. The problem has been gradually worsening. There has been no fever. Associated symptoms include abdominal pain. Pertinent negatives include no chest pain, congestion, coughing (yellow secretions), diarrhea, headaches, nausea (mild nausea), rash, sinus pain or sore throat. He has tried decongestant and NSAIDs (thera flu and cough drops) for the symptoms. The treatment provided no relief.     Past Medical History:  Diagnosis Date  . 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   Current Outpatient Medications:  .  cyclobenzaprine (FEXMID) 7.5 MG tablet, Take 7.5 mg by mouth 3 (three) times daily as needed for muscle spasms., Disp: , Rfl:  .  gabapentin (NEURONTIN) 100 MG capsule, Take 100 mg by mouth 3 (three) times daily. Take 3 tablets daily as needed for pain, Disp: , Rfl:  .  HYDROcodone-acetaminophen (NORCO/VICODIN) 5-325 MG tablet, Take 1 tablet by mouth every 6 (six) hours as needed for moderate pain., Disp: , Rfl:  .  topiramate (TOPAMAX) 50 MG tablet, Take 50 mg by mouth 2 (two) times daily. TAKE ONE TABLET IN THE MORNING AND 1 1/2 TAB AT BEDTIME, Disp: , Rfl:    Allergies  Allergen Reactions  . Aspirin      Glucose-6-dehydrogenase deficiency.     Review of Systems  Constitutional: Negative for fatigue.  HENT: Negative for congestion, sinus pain and sore throat.   Eyes: Negative for photophobia.  Respiratory: Negative for cough (yellow secretions).   Cardiovascular: Negative.  Negative for chest pain, palpitations and leg swelling.  Gastrointestinal: Positive for abdominal pain. Negative for constipation,  diarrhea and nausea (mild nausea).       Has not had any solid food for the last week.    Genitourinary:       Describes having dark colored urine  Skin: Negative for color change, pallor and rash.  Neurological: Negative for dizziness and headaches.     Today's Vitals   09/05/18 0912  BP: 110/62  Pulse: 88  Temp: 99 F (37.2 C)  TempSrc: Oral  Weight: 220 lb (99.8 kg)  Height: 6' 1.25" (1.861 m)  PainSc: 0-No pain   Body mass index is 28.83 kg/m.   Objective:  Physical Exam Vitals signs reviewed.  Constitutional:      General: He is not in acute distress.    Appearance: Normal appearance. He is ill-appearing.     Comments: Gait is slow  HENT:     Head: Normocephalic and atraumatic.     Right Ear: Tympanic membrane normal.     Left Ear: Tympanic membrane normal.     Nose: Nose normal. No congestion.     Mouth/Throat:     Mouth: Mucous membranes are moist.  Eyes:     Extraocular Movements: Extraocular movements intact.     Conjunctiva/sclera: Conjunctivae normal.     Pupils: Pupils are equal, round, and reactive to light.  Cardiovascular:     Rate and Rhythm: Normal rate and regular rhythm.     Pulses: Normal pulses.     Heart sounds: Normal heart sounds. No  murmur.  Pulmonary:     Effort: Pulmonary effort is normal. No respiratory distress.     Breath sounds: Normal breath sounds. No wheezing.  Chest:     Chest wall: No tenderness.  Abdominal:     General: Abdomen is flat. Bowel sounds are normal. There is no distension.     Palpations: Abdomen is soft.     Tenderness: There is abdominal tenderness (generalized.).  Neurological:     General: No focal deficit present.     Mental Status: He is alert.  Psychiatric:        Mood and Affect: Mood normal.        Behavior: Behavior normal.         Assessment And Plan:   1. Fever in other diseases  Low grade this morning has taken ibuprofen this morning. - cefTRIAXone (ROCEPHIN) injection 1 g - CBC - CMP14  + Anion Gap  2. Generalized abdominal pain  Urine has greater than 300 protein, and his urine is dark amber in color I am concerned he is dehydrated and have advised him to go to the ER.  Abdomen is tender to palpation with normoactive bowel sounds. - CBC - CMP14 + Anion Gap   3. Acute intractable headache, unspecified headache type  Continues to have headache and is extremely weak, this can be caused by dehydration. However needs to go to ER may need a CT scan of head to evaluate further. He has also had a 15 lb weight loss since his last visit in August 2019   4. Flu-like symptoms  Diagnosed with the flu one week ago  Initial symptoms began one week ago, would think he is improving continues to be fatigued and has a low grade fever.    Has had a 15lb weight loss since August.    Arnette Felts, FNP

## 2018-09-05 NOTE — ED Notes (Signed)
Patient given discharge instructions and verbalized understanding.  Patient stable to discharge at this time.  Patient is alert and oriented to baseline.  No distressed noted at this time.  All belongings taken with the patient at discharge.   

## 2018-09-05 NOTE — ED Notes (Signed)
Patient transported to X-ray 

## 2018-09-05 NOTE — ED Notes (Addendum)
Pt ambulated to bathroom independently

## 2018-09-05 NOTE — ED Notes (Signed)
Patient transported to CT 

## 2018-09-05 NOTE — Addendum Note (Signed)
Addended by: Harle Battiest on: 09/05/2018 11:54 AM   Modules accepted: Orders

## 2018-09-05 NOTE — Discharge Instructions (Addendum)
As discussed, in addition to influenza, you have been diagnosed with pneumonia. It is important to take all medication as directed, drink plenty fluids, get plenty of rest, and do not hesitate to return here for concerning changes in your condition.

## 2018-09-05 NOTE — ED Triage Notes (Signed)
Per Pt he is from home and comes to [place] because he said he went to his  Primary care and they diagnosed him with the flu.  He states that his doctor told him to come here to get IV hydration.  NAD noted at this time AOx4.

## 2018-09-06 LAB — CBC
Hematocrit: 34.3 % — ABNORMAL LOW (ref 37.5–51.0)
Hemoglobin: 11.9 g/dL — ABNORMAL LOW (ref 13.0–17.7)
MCH: 31.2 pg (ref 26.6–33.0)
MCHC: 34.7 g/dL (ref 31.5–35.7)
MCV: 90 fL (ref 79–97)
PLATELETS: 203 10*3/uL (ref 150–450)
RBC: 3.81 x10E6/uL — AB (ref 4.14–5.80)
RDW: 12.4 % (ref 11.6–15.4)
WBC: 11.2 10*3/uL — AB (ref 3.4–10.8)

## 2018-09-06 LAB — CMP14 + ANION GAP
ALT: 13 IU/L (ref 0–44)
AST: 20 IU/L (ref 0–40)
Albumin/Globulin Ratio: 1.2 (ref 1.2–2.2)
Albumin: 3.6 g/dL — ABNORMAL LOW (ref 3.8–4.9)
Alkaline Phosphatase: 88 IU/L (ref 39–117)
Anion Gap: 13 mmol/L (ref 10.0–18.0)
BUN/Creatinine Ratio: 10 (ref 9–20)
BUN: 11 mg/dL (ref 6–24)
Bilirubin Total: 1 mg/dL (ref 0.0–1.2)
CO2: 22 mmol/L (ref 20–29)
Calcium: 8.7 mg/dL (ref 8.7–10.2)
Chloride: 100 mmol/L (ref 96–106)
Creatinine, Ser: 1.05 mg/dL (ref 0.76–1.27)
GFR calc Af Amer: 92 mL/min/1.73
GFR calc non Af Amer: 80 mL/min/1.73
Globulin, Total: 3 g/dL (ref 1.5–4.5)
Glucose: 95 mg/dL (ref 65–99)
Potassium: 4.4 mmol/L (ref 3.5–5.2)
Sodium: 135 mmol/L (ref 134–144)
Total Protein: 6.6 g/dL (ref 6.0–8.5)

## 2018-09-17 DIAGNOSIS — M5136 Other intervertebral disc degeneration, lumbar region: Secondary | ICD-10-CM | POA: Diagnosis not present

## 2018-09-17 DIAGNOSIS — M4802 Spinal stenosis, cervical region: Secondary | ICD-10-CM | POA: Diagnosis not present

## 2018-09-17 DIAGNOSIS — M5416 Radiculopathy, lumbar region: Secondary | ICD-10-CM | POA: Diagnosis not present

## 2018-09-25 DIAGNOSIS — R509 Fever, unspecified: Secondary | ICD-10-CM | POA: Insufficient documentation

## 2018-09-25 DIAGNOSIS — R6889 Other general symptoms and signs: Secondary | ICD-10-CM | POA: Insufficient documentation

## 2018-09-25 DIAGNOSIS — R519 Headache, unspecified: Secondary | ICD-10-CM | POA: Insufficient documentation

## 2018-09-25 DIAGNOSIS — R1084 Generalized abdominal pain: Secondary | ICD-10-CM | POA: Insufficient documentation

## 2018-09-25 DIAGNOSIS — R51 Headache: Secondary | ICD-10-CM

## 2018-10-19 ENCOUNTER — Ambulatory Visit: Payer: BLUE CROSS/BLUE SHIELD | Admitting: Nurse Practitioner

## 2018-10-19 ENCOUNTER — Telehealth: Payer: Self-pay | Admitting: Nurse Practitioner

## 2018-10-19 NOTE — Telephone Encounter (Signed)
PT CONSENT TO VIDEO VISIT FOR Tuesday 10/23/18@200PM . WEBEX INFO HAS BEEN EMAILED TO PATIENT

## 2018-10-23 ENCOUNTER — Ambulatory Visit: Payer: BLUE CROSS/BLUE SHIELD | Admitting: Nurse Practitioner

## 2018-10-23 ENCOUNTER — Other Ambulatory Visit: Payer: Self-pay

## 2019-02-21 ENCOUNTER — Encounter: Payer: Self-pay | Admitting: Nurse Practitioner

## 2019-02-22 ENCOUNTER — Telehealth: Payer: Self-pay

## 2019-02-22 NOTE — Telephone Encounter (Signed)
Pt stated he currently has [facility] and it will not cover his visits here, he will call us back to reschedule his appts when he gets his insurance situated 02/22/2019

## 2019-10-03 ENCOUNTER — Ambulatory Visit (INDEPENDENT_AMBULATORY_CARE_PROVIDER_SITE_OTHER): Payer: 59 | Admitting: Nurse Practitioner

## 2019-10-03 ENCOUNTER — Other Ambulatory Visit: Payer: Self-pay

## 2019-10-03 ENCOUNTER — Encounter: Payer: Self-pay | Admitting: Nurse Practitioner

## 2019-10-03 VITALS — BP 124/74 | HR 79 | Temp 98.1°F | Ht 73.0 in | Wt 229.8 lb

## 2019-10-03 DIAGNOSIS — Z113 Encounter for screening for infections with a predominantly sexual mode of transmission: Secondary | ICD-10-CM

## 2019-10-03 DIAGNOSIS — Z1211 Encounter for screening for malignant neoplasm of colon: Secondary | ICD-10-CM

## 2019-10-03 DIAGNOSIS — M2550 Pain in unspecified joint: Secondary | ICD-10-CM

## 2019-10-03 DIAGNOSIS — Z23 Encounter for immunization: Secondary | ICD-10-CM

## 2019-10-03 DIAGNOSIS — Z125 Encounter for screening for malignant neoplasm of prostate: Secondary | ICD-10-CM

## 2019-10-03 DIAGNOSIS — Z0000 Encounter for general adult medical examination without abnormal findings: Secondary | ICD-10-CM | POA: Diagnosis not present

## 2019-10-03 DIAGNOSIS — G8929 Other chronic pain: Secondary | ICD-10-CM | POA: Diagnosis not present

## 2019-10-03 DIAGNOSIS — M542 Cervicalgia: Secondary | ICD-10-CM | POA: Diagnosis not present

## 2019-10-03 DIAGNOSIS — R519 Headache, unspecified: Secondary | ICD-10-CM

## 2019-10-03 DIAGNOSIS — Z1159 Encounter for screening for other viral diseases: Secondary | ICD-10-CM | POA: Diagnosis not present

## 2019-10-03 LAB — POCT URINALYSIS DIPSTICK
Bilirubin, UA: NEGATIVE
Blood, UA: NEGATIVE
Glucose, UA: NEGATIVE
Ketones, UA: NEGATIVE
Leukocytes, UA: NEGATIVE
Nitrite, UA: NEGATIVE
Protein, UA: POSITIVE — AB
Spec Grav, UA: 1.02 (ref 1.010–1.025)
Urobilinogen, UA: 2 E.U./dL — AB
pH, UA: 6 (ref 5.0–8.0)

## 2019-10-03 MED ORDER — HYDROCODONE-ACETAMINOPHEN 5-325 MG PO TABS: 1.0000 | ORAL_TABLET | Freq: Four times a day (QID) | ORAL | 0 refills | Status: DC | PRN

## 2019-10-03 MED ORDER — TOPIRAMATE 50 MG PO TABS: 50.0000 mg | ORAL_TABLET | Freq: Two times a day (BID) | ORAL | 1 refills | Status: DC

## 2019-10-03 MED ORDER — TETANUS-DIPHTH-ACELL PERTUSSIS 5-2.5-18.5 LF-MCG/0.5 IM SUSP
0.5000 mL | Freq: Once | INTRAMUSCULAR | Status: AC
  Administered 2019-10-03: 0.5 mL via INTRAMUSCULAR

## 2019-10-03 MED ORDER — GABAPENTIN 300 MG PO CAPS: ORAL_CAPSULE | ORAL | 1 refills | Status: DC

## 2019-10-03 MED ORDER — CYCLOBENZAPRINE HCL 7.5 MG PO TABS: 7.5000 mg | ORAL_TABLET | Freq: Three times a day (TID) | ORAL | 3 refills | Status: DC | PRN

## 2019-10-03 MED ORDER — ACETAMINOPHEN 500 MG PO TABS: 500.0000 mg | ORAL_TABLET | Freq: Two times a day (BID) | ORAL | 0 refills | Status: AC

## 2019-10-03 MED ORDER — TRIAMCINOLONE ACETONIDE 40 MG/ML IJ SUSP
40.0000 mg | Freq: Once | INTRAMUSCULAR | Status: AC
  Administered 2019-10-03: 40 mg via INTRAMUSCULAR

## 2019-10-03 NOTE — Patient Instructions (Signed)

## 2019-10-03 NOTE — Progress Notes (Signed)
This visit occurred during the SARS-CoV-2 public health emergency.  Safety protocols were in place, including screening questions prior to the visit, additional usage of staff PPE, and extensive cleaning of exam room while observing appropriate contact time as indicated for disinfecting solutions.  Subjective:     Patient ID: Alan Walsh , male    DOB: [birth year] , 54 y.o.   MRN: [id number]   Chief Complaint  Patient presents with  . Annual Exam    HPI  Here for HM     Men's preventive visit. Patient Health Questionnaire (PHQ-2) is    Office Visit from 10/03/2019 in [facility]  PHQ-2 Total Score  0     Patient is on a regular diet. Exercise -  By working he gets his exercise, he is unloading trucks.  Marital status: Divorced. Relevant history for alcohol use is:  Social History   Substance and Sexual Activity  Alcohol Use No   Relevant history for tobacco use is:  Social History   Tobacco Use  Smoking Status Current Every Day Smoker  Smokeless Tobacco Never Used  . Past Medical History:  Diagnosis Date  . 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   Current Outpatient Medications:  .  cyclobenzaprine (FEXMID) 7.5 MG tablet, Take 7.5 mg by mouth 3 (three) times daily as needed for muscle spasms., Disp: , Rfl:  .  gabapentin (NEURONTIN) 100 MG capsule, Take 100 mg by mouth 3 (three) times daily. Take 3 tablets daily as needed for pain, Disp: , Rfl:  .  topiramate (TOPAMAX) 50 MG tablet, Take 50 mg by mouth 2 (two) times daily. TAKE ONE TABLET IN THE MORNING AND 1 1/2 TAB AT BEDTIME, Disp: , Rfl:  .  HYDROcodone-acetaminophen (NORCO/VICODIN) 5-325 MG tablet, Take 1 tablet by mouth every 6 (six) hours as needed for moderate pain., Disp: , Rfl:    Allergies  Allergen Reactions  . Aspirin      Glucose-6-dehydrogenase deficiency.     Review of Systems  Constitutional: Negative.   HENT: Negative.   Eyes: Negative.   Respiratory:  Negative.   Cardiovascular: Negative.   Gastrointestinal: Negative.   Genitourinary: Negative.   Musculoskeletal: Negative.   Skin: Negative.   Allergic/Immunologic: Negative.   Neurological: Negative.   Hematological: Negative.   Psychiatric/Behavioral: Negative.      Today's Vitals   10/03/19 0924  BP: 124/74  Pulse: 79  Temp: 98.1 F (36.7 C)  TempSrc: Oral  Weight: 229 lb 12.8 oz (104.2 kg)  Height: 6' 1" (1.854 m)  PainSc: 7   PainLoc: Leg   Body mass index is 30.32 kg/m.   Objective:  Physical Exam Vitals reviewed.  Constitutional:      General: He is not in acute distress.    Appearance: Normal appearance. He is obese.  HENT:     Head: Normocephalic and atraumatic.     Right Ear: Tympanic membrane, ear canal and external ear normal. There is no impacted cerumen.     Left Ear: Tympanic membrane, ear canal and external ear normal. There is no impacted cerumen.  Cardiovascular:     Rate and Rhythm: Normal rate and regular rhythm.     Pulses: Normal pulses.     Heart sounds: Normal heart sounds. No murmur.  Pulmonary:     Effort: Pulmonary effort is normal. No respiratory distress.     Breath sounds: Normal breath sounds.  Abdominal:     General:  Abdomen is flat. Bowel sounds are normal. There is no distension.     Palpations: Abdomen is soft.  Genitourinary:    Prostate: Normal.     Rectum: Guaiac result negative.  Musculoskeletal:        General: No deformity.     Cervical back: Normal range of motion and neck supple.     Right lower leg: No edema.     Left lower leg: No edema.     Comments: Limited range of motion to his neck Tenderness to left heel area Has bilateral knee brace and right ankle  Skin:    General: Skin is warm.     Capillary Refill: Capillary refill takes less than 2 seconds.  Neurological:     General: No focal deficit present.     Mental Status: He is alert and oriented to person, place, and time.  Psychiatric:        Mood and  Affect: Mood normal.        Behavior: Behavior normal.        Thought Content: Thought content normal.        Judgment: Judgment normal.         Assessment And Plan:     1. Encounter for general adult medical examination w/o abnormal findings . Behavior modifications discussed and diet history reviewed.   . Pt will continue to exercise regularly and modify diet with low GI, plant based foods and decrease intake of processed foods.  . Recommend intake of daily multivitamin, Vitamin D, and calcium.  . Recommend for preventive screenings, as well as recommend immunizations that include influenza, TDAP - POCT Urinalysis Dipstick (81002) - CMP14+EGFR - Lipid panel - CBC  2. Chronic nonintractable headache, unspecified headache type  Chronic, refilled his topamax - topiramate (TOPAMAX) 50 MG tablet; Take 1 tablet (50 mg total) by mouth 2 (two) times daily.  Dispense: 180 tablet; Refill: 1  3. Arthralgia, unspecified joint  Chronic, will check autoimmune profile  Advised to take tylenol extra strength 500 mg twice a day scheduled - Sed Rate (ESR) - Rheumatoid (RA) Factor - ANA, IFA (with reflex) - acetaminophen (TYLENOL) 500 MG tablet; Take 1 tablet (500 mg total) by mouth 2 (two) times daily.  Dispense: 60 tablet; Refill: 0 - triamcinolone acetonide (KENALOG-40) injection 40 mg  4. Encounter for hepatitis C screening test for low risk patient  Will check for Hepatitis C screening due to being born between the years 35-1967 - Hepatitis C antibody  5. Neck pain  Kenalog given due to him being allergic to aspirin will not given toradol  Referred to Dr Maryjean Ka who he has seen before and he may be able to offer him injections - Ambulatory referral to Neurosurgery - triamcinolone acetonide (KENALOG-40) injection 40 mg - cyclobenzaprine (FEXMID) 7.5 MG tablet; Take 1 tablet (7.5 mg total) by mouth 3 (three) times daily as needed for muscle spasms.  Dispense: 30 tablet; Refill:  3 - HYDROcodone-acetaminophen (NORCO/VICODIN) 5-325 MG tablet; Take 1 tablet by mouth every 6 (six) hours as needed for moderate pain.  Dispense: 30 tablet; Refill: 0  6. Encounter for immunization Will give tetanus vaccine today while in office. Refer to order management. TDAP will be administered to adults 45-10 years old every 10 years. - Tdap (BOOSTRIX) injection 0.5 mL  7. Encounter for prostate cancer screening  - PSA  8. Screening for STD (sexually transmitted disease)  - HIV antibody (with reflex)   Minette Brine, FNP  THE PATIENT IS ENCOURAGED TO PRACTICE SOCIAL DISTANCING DUE TO THE COVID-19 PANDEMIC.

## 2019-10-05 LAB — ANTINUCLEAR ANTIBODIES, IFA: ANA Titer 1: NEGATIVE

## 2019-10-05 LAB — CBC
Hematocrit: 36.2 % — ABNORMAL LOW (ref 37.5–51.0)
Hemoglobin: 12.1 g/dL — ABNORMAL LOW (ref 13.0–17.7)
MCH: 31.4 pg (ref 26.6–33.0)
MCHC: 33.4 g/dL (ref 31.5–35.7)
MCV: 94 fL (ref 79–97)
Platelets: 284 10*3/uL (ref 150–450)
RBC: 3.85 x10E6/uL — ABNORMAL LOW (ref 4.14–5.80)
RDW: 12.2 % (ref 11.6–15.4)
WBC: 6.2 10*3/uL (ref 3.4–10.8)

## 2019-10-05 LAB — CMP14+EGFR
ALT: 14 IU/L (ref 0–44)
AST: 23 IU/L (ref 0–40)
Albumin/Globulin Ratio: 1.6 (ref 1.2–2.2)
Albumin: 4.1 g/dL (ref 3.8–4.9)
Alkaline Phosphatase: 111 IU/L (ref 39–117)
BUN/Creatinine Ratio: 10 (ref 9–20)
BUN: 13 mg/dL (ref 6–24)
Bilirubin Total: 0.4 mg/dL (ref 0.0–1.2)
CO2: 19 mmol/L — ABNORMAL LOW (ref 20–29)
Calcium: 8.9 mg/dL (ref 8.7–10.2)
Chloride: 106 mmol/L (ref 96–106)
Creatinine, Ser: 1.27 mg/dL (ref 0.76–1.27)
GFR calc Af Amer: 73 mL/min/{1.73_m2} (ref >59)
GFR calc non Af Amer: 63 mL/min/{1.73_m2} (ref >59)
Globulin, Total: 2.6 g/dL (ref 1.5–4.5)
Glucose: 79 mg/dL (ref 65–99)
Potassium: 3.9 mmol/L (ref 3.5–5.2)
Sodium: 142 mmol/L (ref 134–144)
Total Protein: 6.7 g/dL (ref 6.0–8.5)

## 2019-10-05 LAB — SEDIMENTATION RATE: Sed Rate: 10 mm/hr (ref 0–30)

## 2019-10-05 LAB — LIPID PANEL
Chol/HDL Ratio: 3.4 ratio (ref 0.0–5.0)
Cholesterol, Total: 151 mg/dL (ref 100–199)
HDL: 45 mg/dL (ref >39)
LDL Chol Calc (NIH): 90 mg/dL (ref 0–99)
Triglycerides: 83 mg/dL (ref 0–149)
VLDL Cholesterol Cal: 16 mg/dL (ref 5–40)

## 2019-10-05 LAB — RHEUMATOID FACTOR: Rhuematoid fact SerPl-aCnc: <10 IU/mL (ref 0.0–13.9)

## 2019-10-05 LAB — HEPATITIS C ANTIBODY: Hep C Virus Ab: <0.1 s/co ratio (ref 0.0–0.9)

## 2019-10-05 LAB — PSA: Prostate Specific Ag, Serum: 0.5 ng/mL (ref 0.0–4.0)

## 2019-10-05 LAB — HIV ANTIBODY (ROUTINE TESTING W REFLEX): HIV Screen 4th Generation wRfx: NONREACTIVE

## 2019-10-07 ENCOUNTER — Other Ambulatory Visit: Payer: Self-pay | Admitting: Nurse Practitioner

## 2019-10-07 DIAGNOSIS — M542 Cervicalgia: Secondary | ICD-10-CM

## 2019-10-07 MED ORDER — HYDROCODONE-ACETAMINOPHEN 5-325 MG PO TABS: 1.0000 | ORAL_TABLET | Freq: Four times a day (QID) | ORAL | 0 refills | Status: DC | PRN

## 2020-01-27 IMAGING — CT CT CHEST W/O CM
2 of 3 series · 15 of 36 positions shown, 18 images · non-contrast
Comparison: Chest radiograph 09/05/2018 and 02/17/2014.

CLINICAL DATA: Shortness of breath, chest pain and flu symptoms.

EXAM:
CT CHEST WITHOUT CONTRAST
TECHNIQUE: Multidetector CT imaging of the chest was performed following the
standard protocol without IV contrast.

[Series 3: chest w/o 2mm st · axial · non-contrast · 0.89mm/px · z∈[+1060,+1362]mm · 12 of 179 slices shown, 15 images]
[im 14/179  mediastinal]
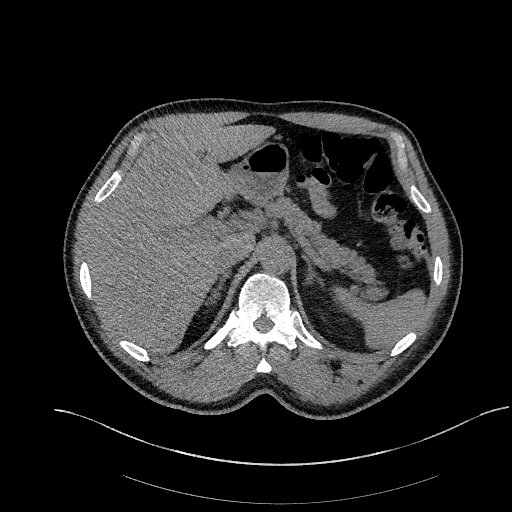
[im 14/179  lung]
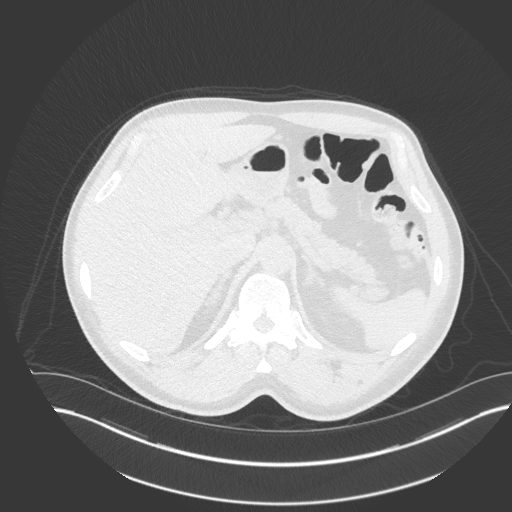
[im 27/179  lung]
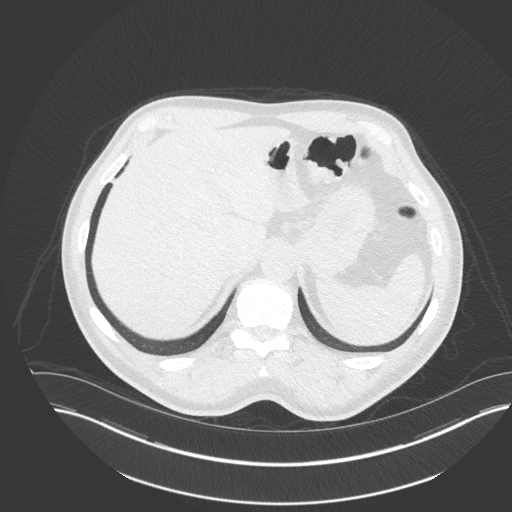
[im 40/179  lung]
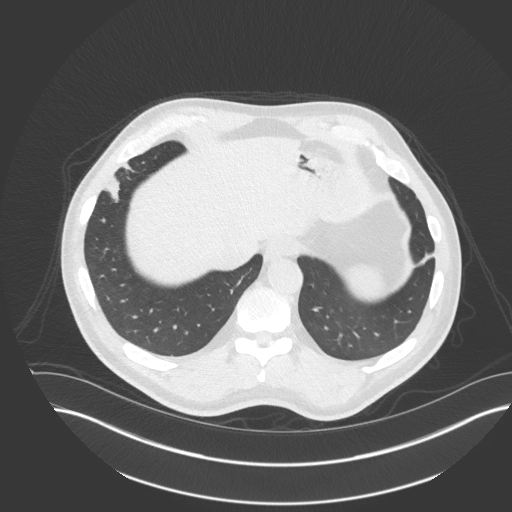
[im 53/179  lung]
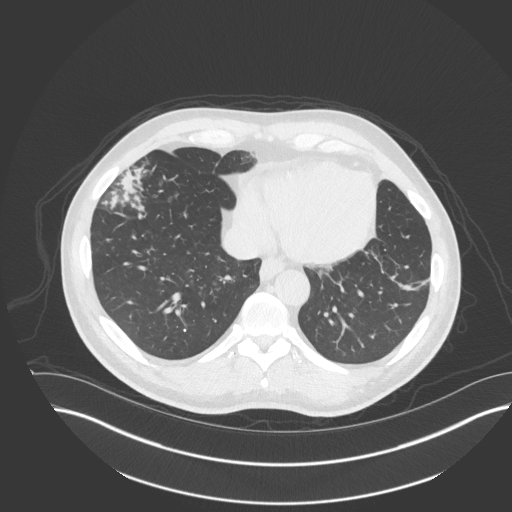
[im 66/179  mediastinal]
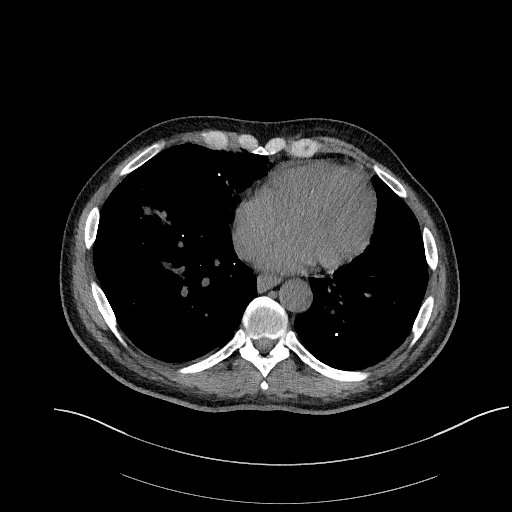
[im 66/179  lung]
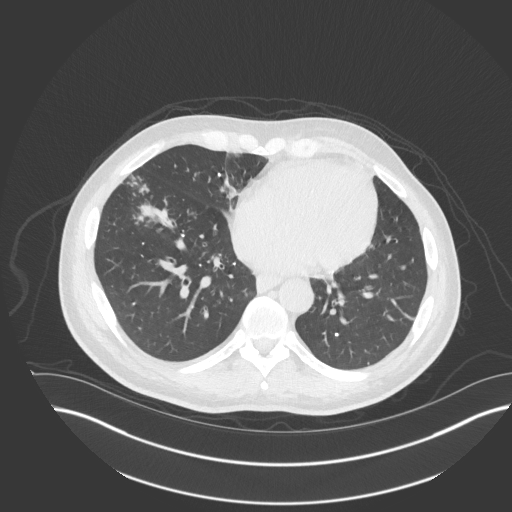
[im 80/179  lung]
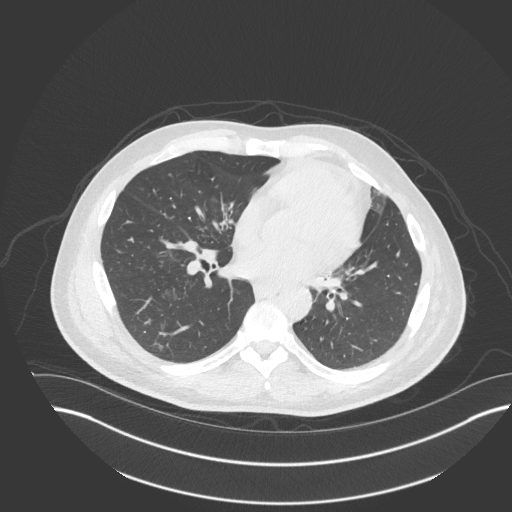
[im 99/179  lung]
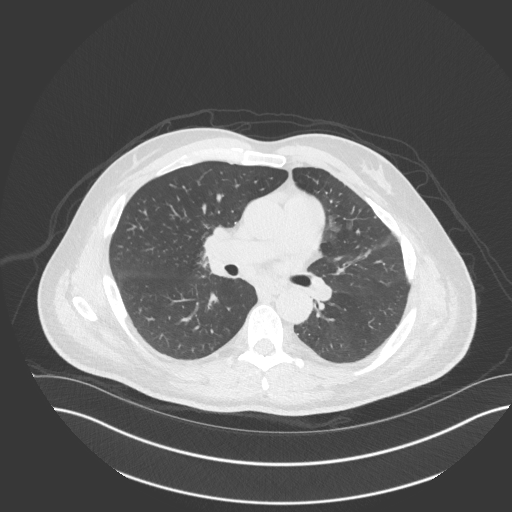
[im 113/179  lung]
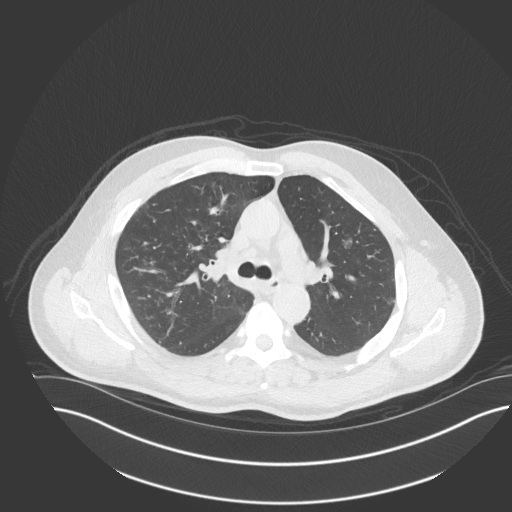
[im 126/179  mediastinal]
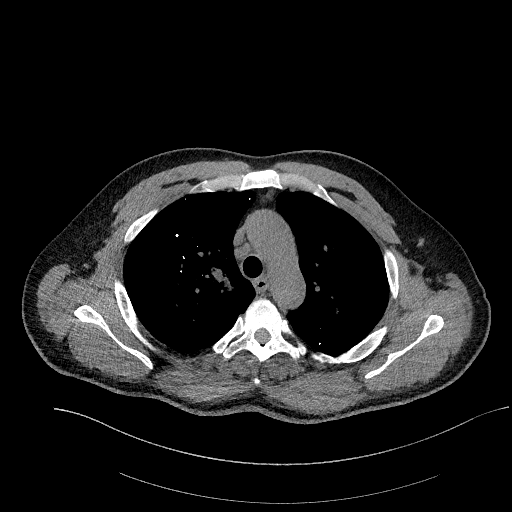
[im 126/179  lung]
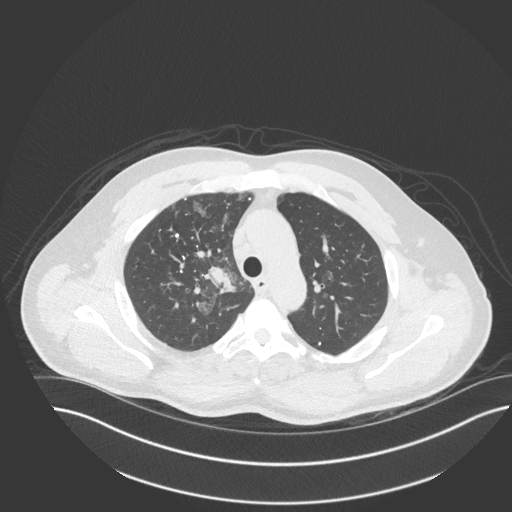
[im 139/179  lung]
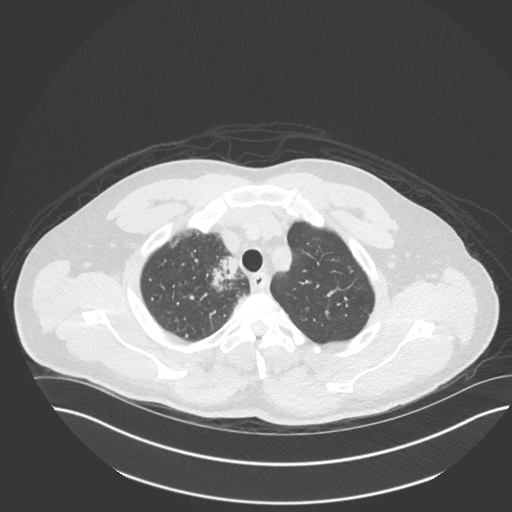
[im 152/179  lung]
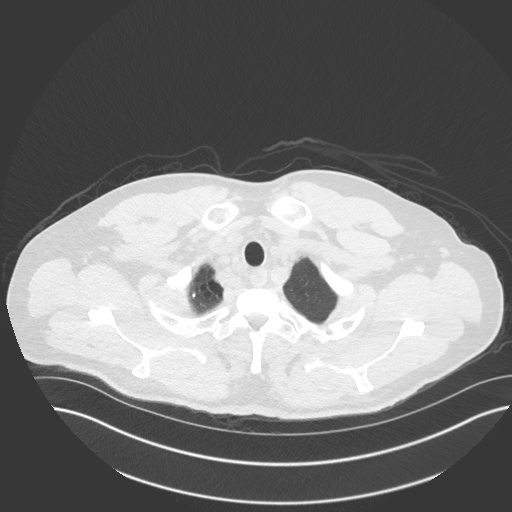
[im 165/179  lung]
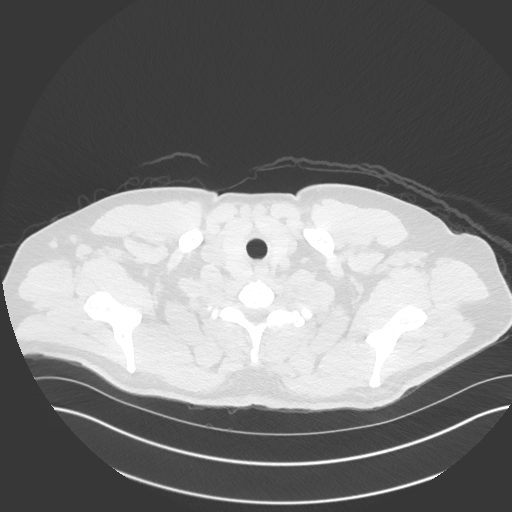

[Series 5: chest w/o 3mm st cor · coronal · non-contrast · 0.70mm/px · 3 of 89 slices shown]
[im 18/89  lung]
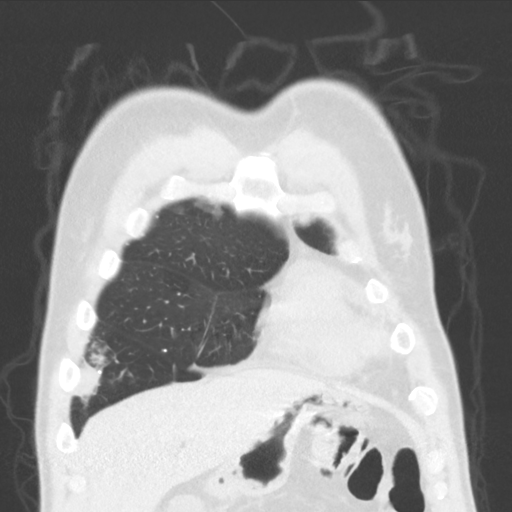
[im 36/89  lung]
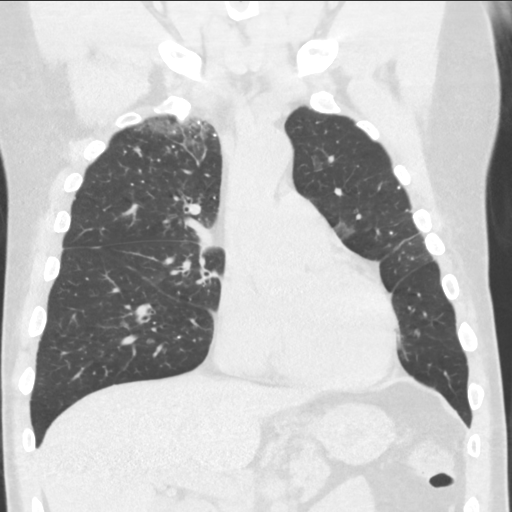
[im 53/89  lung]
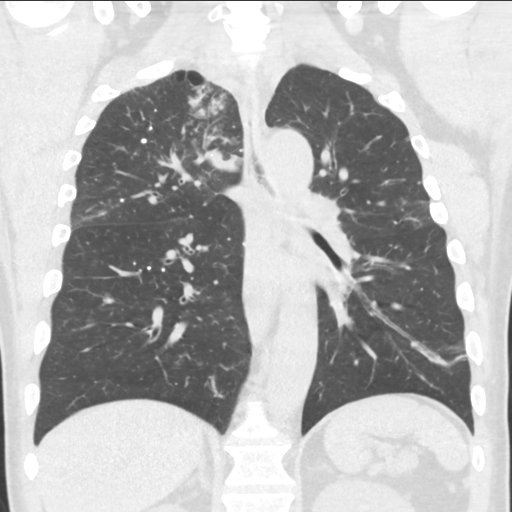

[15 of 36 positions shown; findings below may reference images not displayed]

FINDINGS: Cardiovascular: Atherosclerotic calcification of the aorta. Heart
size normal. No pericardial effusion.

Mediastinum/Nodes: Mediastinal lymph nodes are subcentimeter in
short axis size. Hilar regions are difficult to evaluate without IV
contrast. No axillary adenopathy. Esophagus is grossly unremarkable.

Lungs/Pleura: Peribronchovascular nodularity, consolidation and
ground-glass are scattered throughout all lobes of both lungs.
Minimal associated septal thickening in the right upper lobe..
Numerous calcified granulomas. No pleural fluid. Airway is
unremarkable.

Upper Abdomen: Visualized portions of the liver, gallbladder,
adrenal glands, kidneys, spleen, pancreas, stomach and bowel are
grossly unremarkable.

Musculoskeletal: No worrisome lytic or sclerotic lesions.
IMPRESSION: 1. Multilobar peribronchovascular nodularity, consolidation and
ground-glass, most indicative of bronchopneumonia. Consider
follow-up CT chest without contrast in 3-4 weeks to ensure
resolution and exclude underlying malignancy.
2.  Aortic atherosclerosis (QDU0J-170.0).

## 2020-01-27 IMAGING — DX DG CHEST 2V
2 series · 2 of 2 positions shown · non-contrast
Comparison: 02/17/2014

CLINICAL DATA: Influenza for 1 week. Chest pain and shortness of
breath

EXAM:
CHEST - 2 VIEW

[w chest lat (1 of 2)]
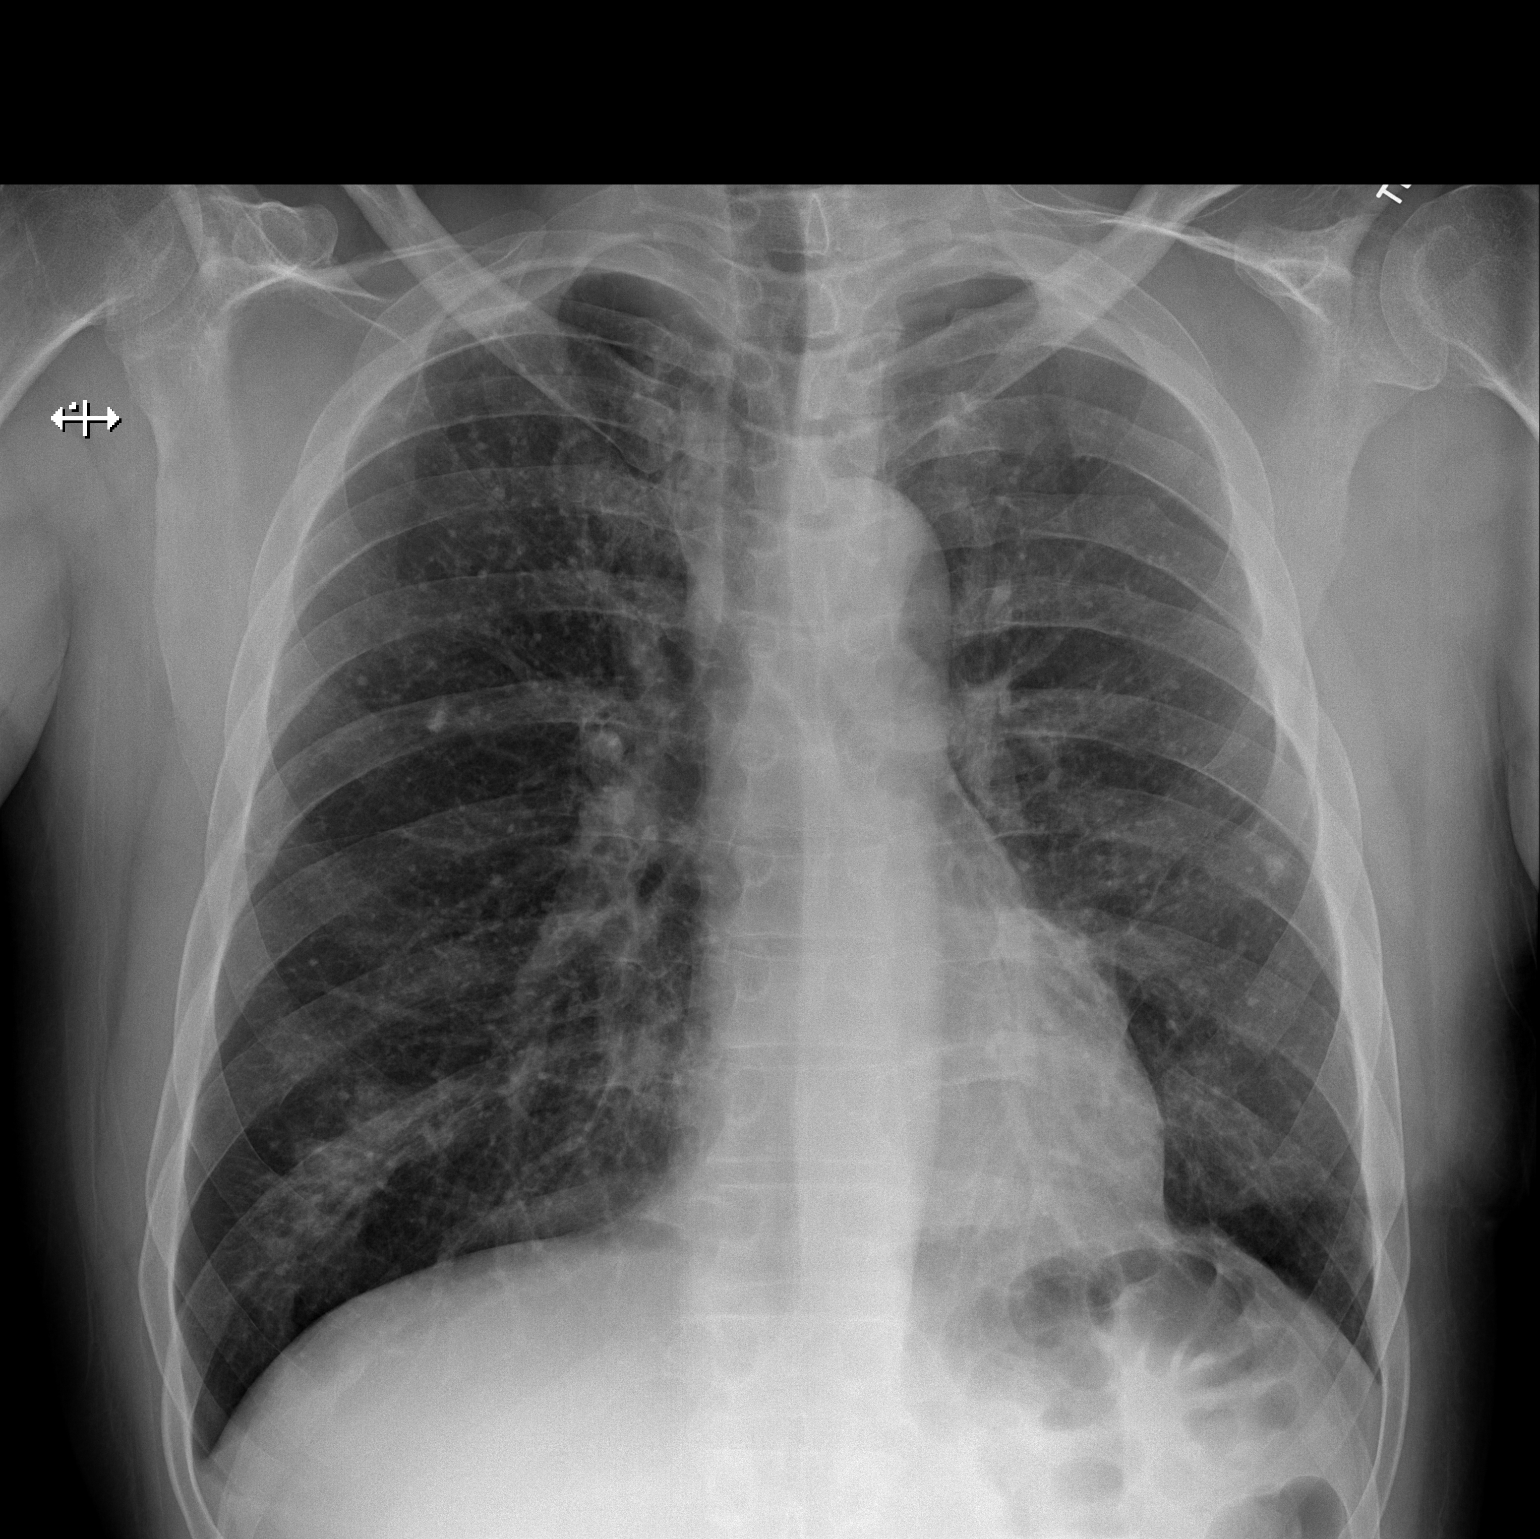

[w chest lat (2 of 2)]
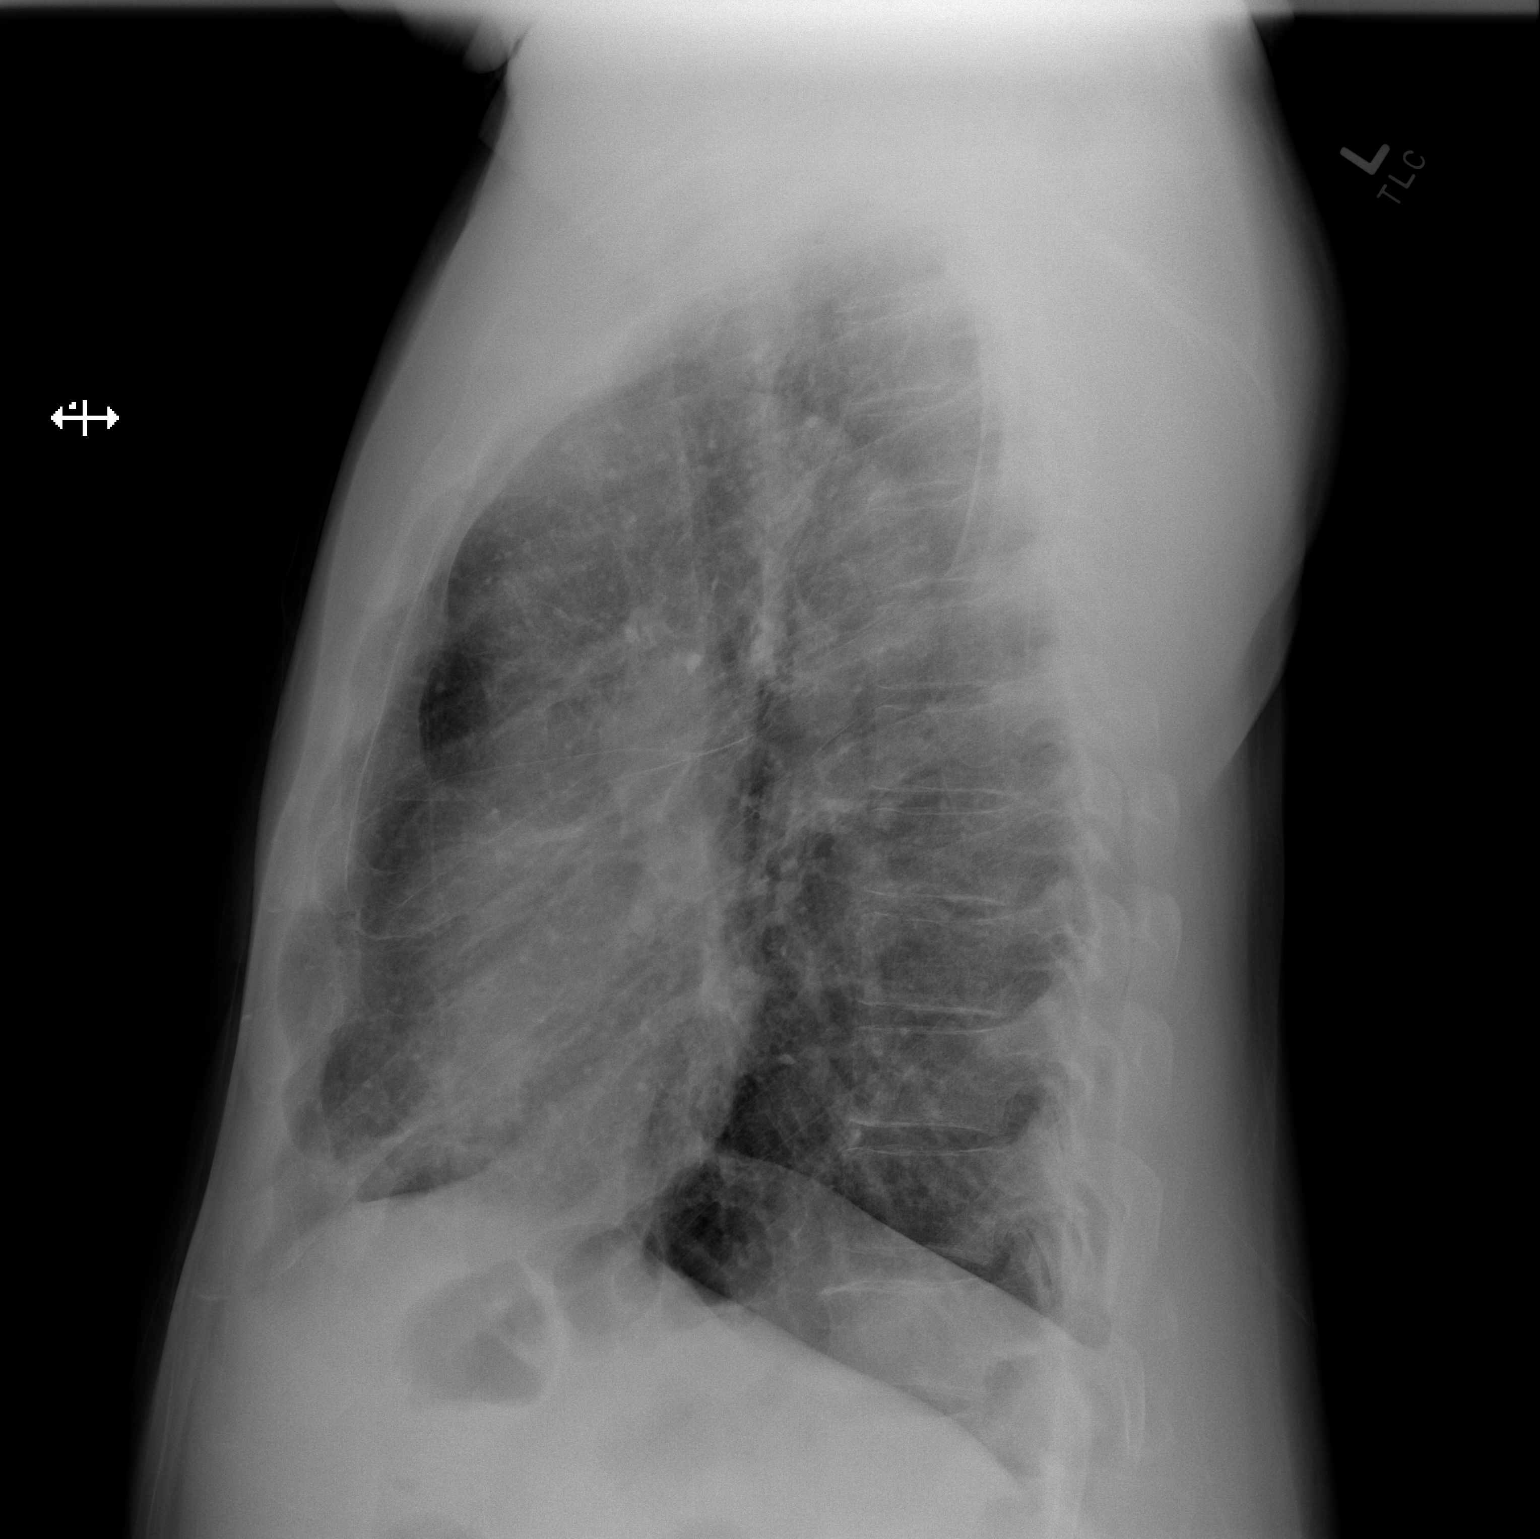

[2 of 2 positions shown; findings below may reference images not displayed]

FINDINGS: Scattered tiny calcified pulmonary nodules not appreciably changed
from 02/17/2014. Suspected lingular scarring, chronic. Confluent
right basilar density is increased in prominence and although at
least partially due to confluent vascular and bony structures,
underlying pneumonia or mass in the right lower lobe is not readily
excluded. Mild airway thickening. Chronic left fifth rib deformity
likely from an old fracture. No blunting of the costophrenic angles.
IMPRESSION: 1. Confluent density in the right middle lobe could reflect
pneumonia or a pulmonary nodule. Consider chest CT for further
workup.
2. Scattered chronic calcified pulmonary nodules likely from old
miliary granulomatous disease.
3. Scarring or atelectasis along the lingula.

## 2020-04-07 ENCOUNTER — Ambulatory Visit (INDEPENDENT_AMBULATORY_CARE_PROVIDER_SITE_OTHER): Payer: 59 | Admitting: Nurse Practitioner

## 2020-04-07 ENCOUNTER — Ambulatory Visit: Payer: 59 | Admitting: Nurse Practitioner

## 2020-04-07 ENCOUNTER — Encounter: Payer: Self-pay | Admitting: Nurse Practitioner

## 2020-04-07 ENCOUNTER — Other Ambulatory Visit: Payer: Self-pay

## 2020-04-07 VITALS — BP 116/68 | HR 72 | Temp 97.3°F | Ht 70.4 in | Wt 207.2 lb

## 2020-04-07 DIAGNOSIS — R519 Headache, unspecified: Secondary | ICD-10-CM | POA: Diagnosis not present

## 2020-04-07 DIAGNOSIS — M542 Cervicalgia: Secondary | ICD-10-CM | POA: Diagnosis not present

## 2020-04-07 DIAGNOSIS — Z6829 Body mass index (BMI) 29.0-29.9, adult: Secondary | ICD-10-CM | POA: Diagnosis not present

## 2020-04-07 DIAGNOSIS — R634 Abnormal weight loss: Secondary | ICD-10-CM

## 2020-04-07 DIAGNOSIS — M2550 Pain in unspecified joint: Secondary | ICD-10-CM | POA: Diagnosis not present

## 2020-04-07 DIAGNOSIS — G8929 Other chronic pain: Secondary | ICD-10-CM

## 2020-04-07 MED ORDER — HYDROCODONE-ACETAMINOPHEN 5-325 MG PO TABS: 1.0000 | ORAL_TABLET | Freq: Four times a day (QID) | ORAL | 0 refills | Status: DC | PRN

## 2020-04-07 MED ORDER — TOPIRAMATE 50 MG PO TABS: 50.0000 mg | ORAL_TABLET | Freq: Two times a day (BID) | ORAL | 1 refills | Status: DC

## 2020-04-07 MED ORDER — CYCLOBENZAPRINE HCL 7.5 MG PO TABS: 7.5000 mg | ORAL_TABLET | Freq: Three times a day (TID) | ORAL | 3 refills | Status: DC | PRN

## 2020-04-07 NOTE — Progress Notes (Signed)
I,Yamilka Roman [employer] as a Education administrator for [employer], FNP.,have documented all relevant documentation on the behalf of Minette Brine, FNP,as directed by  Minette Brine, FNP while in the presence of Minette Brine, [place].  This visit occurred during the SARS-CoV-2 public health emergency.  Safety protocols were in place, including screening questions prior to the visit, additional usage of staff PPE, and extensive cleaning of exam room while observing appropriate contact time as indicated for disinfecting solutions.  Subjective:     Patient ID: Alan Walsh , male    DOB: 01/30/1966 , 54 y.o.   MRN: [id number]   Chief Complaint  Patient presents with  . Migraine  . joint pain    HPI  He went to see Dr. Katherine Roan (Neurosurgery) who had done some xrays and had mentioned to him he may need surgery. He did not agree with the plan and he is unaware of the results of the xray as he has not been back to Dr. Katherine Roan since that time.  He had been seeing Dr. Luan Pulling who was no longer taking his insurance.  He advises me that Dr. Luan Pulling was administering injections to his neck for his pain. He was told he has some calcium build up causing him pain. He has taken vicodin in the past as well but has not had any since earlier this year.    Wt Readings from Last 3 Encounters: 04/07/20 : 207 lb 3.2 oz (94 kg) 10/03/19 : 229 lb 12.8 oz (104.2 kg) 09/05/18 : 220 lb (99.8 kg)  He is working a job that he is walking approximately 20 miles a night.   His brother passed from myeloma in the last 3-4 months.     Migraine  This is a chronic problem. The current episode started more than 1 year ago. The problem occurs intermittently. Radiates to: neck pain. The pain quality is not similar to prior headaches. The quality of the pain is described as aching. Associated symptoms include back pain and neck pain. Pertinent negatives include no abdominal pain, dizziness or numbness. His past medical history is  significant for migraine headaches. There is no history of cancer.     Past Medical History:  Diagnosis Date  . G-6-PD deficiency      History reviewed. No pertinent family history.   Current Outpatient Medications:  .  acetaminophen (TYLENOL) 500 MG tablet, Take 1 tablet (500 mg total) by mouth 2 (two) times daily., Disp: 60 tablet, Rfl: 0 .  cyclobenzaprine (FEXMID) 7.5 MG tablet, Take 1 tablet (7.5 mg total) by mouth 3 (three) times daily as needed for muscle spasms., Disp: 30 tablet, Rfl: 3 .  gabapentin (NEURONTIN) 300 MG capsule, Take 2 tablets by mouth at bedtime, Disp: 180 capsule, Rfl: 1 .  HYDROcodone-acetaminophen (NORCO/VICODIN) 5-325 MG tablet, Take 1 tablet by mouth every 6 (six) hours as needed for moderate pain., Disp: 30 tablet, Rfl: 0 .  topiramate (TOPAMAX) 50 MG tablet, Take 1 tablet (50 mg total) by mouth 2 (two) times daily., Disp: 180 tablet, Rfl: 1   Allergies  Allergen Reactions  . Aspirin      Glucose-6-dehydrogenase deficiency.     Review of Systems  Constitutional: Negative.   Respiratory: Negative.   Cardiovascular: Negative.  Negative for chest pain, palpitations and leg swelling.  Gastrointestinal: Negative for abdominal pain.  Musculoskeletal: Positive for arthralgias, back pain, neck pain and neck stiffness (due to rods in his neck.  ).  Neurological: Positive for headaches. Negative for  dizziness and numbness.  Psychiatric/Behavioral: Negative.      Today's Vitals   04/07/20 0922  BP: 116/68  Pulse: 72  Temp: (!) 97.3 F (36.3 C)  TempSrc: Oral  Weight: 207 lb 3.2 oz (94 kg)  Height: 5' 10.4" (1.788 m)  PainSc: 8   PainLoc: Back   Body mass index is 29.39 kg/m.   Objective:  Physical Exam Vitals reviewed.  Constitutional:      General: He is not in acute distress.    Appearance: Normal appearance.  Cardiovascular:     Rate and Rhythm: Normal rate and regular rhythm.     Pulses: Normal pulses.     Heart sounds: No murmur  heard.   Pulmonary:     Effort: Pulmonary effort is normal. No respiratory distress.     Breath sounds: Normal breath sounds.  Musculoskeletal:        General: No tenderness.     Comments: Neck stiffness with decreased range of motion  Neurological:     General: No focal deficit present.     Mental Status: He is alert and oriented to person, place, and time.     Cranial Nerves: No cranial nerve deficit.  Psychiatric:        Mood and Affect: Mood normal.        Behavior: Behavior normal.        Thought Content: Thought content normal.        Judgment: Judgment normal.         Assessment And Plan:     1. Arthralgia, unspecified joint  This is chronic, he would like a referral to the pain clinic since things did not go as he had planned with the neurosurgeon.  He is interested in having injections to his neck for his pain management - Ambulatory referral to Pain Clinic  2. Chronic nonintractable headache, unspecified headache type   Likely related to his chronic neck pain, refill sent for his topiramate - topiramate (TOPAMAX) 50 MG tablet; Take 1 tablet (50 mg total) by mouth 2 (two) times daily.  Dispense: 180 tablet; Refill: 1 - Ambulatory referral to Pain Clinic  3. BMI 29.0-29.9,adult  Continue with eating a healthy diet, has had a 22 lb weight loss since his last visit   4. Neck pain  Chronic, neck pain with stiffness noted  Refill for cyclobenzaprine done with vicodin  PDMP reviewed during this encounter., no recent narcotic refills - cyclobenzaprine (FEXMID) 7.5 MG tablet; Take 1 tablet (7.5 mg total) by mouth 3 (three) times daily as needed for muscle spasms.  Dispense: 30 tablet; Refill: 3 - HYDROcodone-acetaminophen (NORCO/VICODIN) 5-325 MG tablet; Take 1 tablet by mouth every 6 (six) hours as needed for moderate pain.  Dispense: 30 tablet; Refill: 0 - Ambulatory referral to Pain Clinic  5. Abnormal weight loss  He feels is related to his current job with  lifting and walking approximately 20 miles a day  Will check for metabolic causes and may consider sending for evaluation with GI - CMP14+EGFR - TSH - Hemoglobin A1c - CBC    Patient was given opportunity to ask questions. Patient verbalized understanding of the plan and was able to repeat key elements of the plan. All questions were answered to their satisfaction.   Teola Bradley, FNP, have reviewed all documentation for this visit. The documentation on 04/07/20 for the exam, diagnosis, procedures, and orders are all accurate and complete.   THE PATIENT IS ENCOURAGED TO PRACTICE SOCIAL DISTANCING DUE  TO THE COVID-19 PANDEMIC.

## 2020-04-08 LAB — CMP14+EGFR
ALT: 16 IU/L (ref 0–44)
AST: 22 IU/L (ref 0–40)
Albumin/Globulin Ratio: 1.7 (ref 1.2–2.2)
Albumin: 4.5 g/dL (ref 3.8–4.9)
Alkaline Phosphatase: 107 IU/L (ref 44–121)
BUN/Creatinine Ratio: 14 (ref 9–20)
BUN: 16 mg/dL (ref 6–24)
Bilirubin Total: 0.4 mg/dL (ref 0.0–1.2)
CO2: 19 mmol/L — ABNORMAL LOW (ref 20–29)
Calcium: 9.3 mg/dL (ref 8.7–10.2)
Chloride: 105 mmol/L (ref 96–106)
Creatinine, Ser: 1.18 mg/dL (ref 0.76–1.27)
GFR calc Af Amer: 79 mL/min/{1.73_m2} (ref >59)
GFR calc non Af Amer: 69 mL/min/{1.73_m2} (ref >59)
Globulin, Total: 2.7 g/dL (ref 1.5–4.5)
Glucose: 86 mg/dL (ref 65–99)
Potassium: 3.8 mmol/L (ref 3.5–5.2)
Sodium: 139 mmol/L (ref 134–144)
Total Protein: 7.2 g/dL (ref 6.0–8.5)

## 2020-04-08 LAB — CBC
Hematocrit: 36.6 % — ABNORMAL LOW (ref 37.5–51.0)
Hemoglobin: 12.5 g/dL — ABNORMAL LOW (ref 13.0–17.7)
MCH: 32.1 pg (ref 26.6–33.0)
MCHC: 34.2 g/dL (ref 31.5–35.7)
MCV: 94 fL (ref 79–97)
Platelets: 281 10*3/uL (ref 150–450)
RBC: 3.9 x10E6/uL — ABNORMAL LOW (ref 4.14–5.80)
RDW: 12.3 % (ref 11.6–15.4)
WBC: 7.9 10*3/uL (ref 3.4–10.8)

## 2020-04-08 LAB — HEMOGLOBIN A1C
Est. average glucose Bld gHb Est-mCnc: 80 mg/dL
Hgb A1c MFr Bld: 4.4 % — ABNORMAL LOW (ref 4.8–5.6)

## 2020-04-08 LAB — TSH: TSH: 1.79 u[IU]/mL (ref 0.450–4.500)

## 2020-04-20 ENCOUNTER — Other Ambulatory Visit: Payer: Self-pay

## 2020-04-20 DIAGNOSIS — R519 Headache, unspecified: Secondary | ICD-10-CM

## 2020-04-20 MED ORDER — CYCLOBENZAPRINE HCL 10 MG PO TABS: 10.0000 mg | ORAL_TABLET | Freq: Three times a day (TID) | ORAL | 0 refills | Status: DC | PRN

## 2020-04-20 MED ORDER — TOPIRAMATE 50 MG PO TABS: 50.0000 mg | ORAL_TABLET | Freq: Two times a day (BID) | ORAL | 1 refills | Status: DC

## 2020-10-06 ENCOUNTER — Encounter: Payer: 59 | Admitting: Nurse Practitioner

## 2020-10-10 ENCOUNTER — Other Ambulatory Visit: Payer: Self-pay | Admitting: Nurse Practitioner

## 2020-10-10 DIAGNOSIS — R519 Headache, unspecified: Secondary | ICD-10-CM

## 2020-10-10 DIAGNOSIS — G8929 Other chronic pain: Secondary | ICD-10-CM

## 2020-11-19 ENCOUNTER — Other Ambulatory Visit: Payer: Self-pay

## 2020-11-19 ENCOUNTER — Encounter: Payer: Self-pay | Admitting: Nurse Practitioner

## 2020-11-19 ENCOUNTER — Ambulatory Visit (INDEPENDENT_AMBULATORY_CARE_PROVIDER_SITE_OTHER): Payer: 59 | Admitting: Nurse Practitioner

## 2020-11-19 VITALS — BP 114/76 | HR 69 | Temp 97.7°F | Ht 70.4 in | Wt 201.2 lb

## 2020-11-19 DIAGNOSIS — Z0000 Encounter for general adult medical examination without abnormal findings: Secondary | ICD-10-CM

## 2020-11-19 DIAGNOSIS — Z125 Encounter for screening for malignant neoplasm of prostate: Secondary | ICD-10-CM | POA: Diagnosis not present

## 2020-11-19 DIAGNOSIS — M542 Cervicalgia: Secondary | ICD-10-CM | POA: Diagnosis not present

## 2020-11-19 DIAGNOSIS — G8929 Other chronic pain: Secondary | ICD-10-CM

## 2020-11-19 DIAGNOSIS — Z0001 Encounter for general adult medical examination with abnormal findings: Secondary | ICD-10-CM | POA: Diagnosis not present

## 2020-11-19 MED ORDER — CYCLOBENZAPRINE HCL 10 MG PO TABS: 10.0000 mg | ORAL_TABLET | Freq: Three times a day (TID) | ORAL | 0 refills | Status: AC | PRN

## 2020-11-19 MED ORDER — HYDROCODONE-ACETAMINOPHEN 5-325 MG PO TABS: 1.0000 | ORAL_TABLET | Freq: Four times a day (QID) | ORAL | 0 refills | Status: DC | PRN

## 2020-11-19 MED ORDER — GABAPENTIN 300 MG PO CAPS: ORAL_CAPSULE | ORAL | 1 refills | Status: AC

## 2020-11-19 NOTE — Progress Notes (Signed)
I,Yamilka Roman [employer] as a Education administrator for [employer], FNP.,have documented all relevant documentation on the behalf of Minette Brine, FNP,as directed by  Minette Brine, FNP while in the presence of Minette Brine, [place]. This visit occurred during the SARS-CoV-2 public health emergency.  Safety protocols were in place, including screening questions prior to the visit, additional usage of staff PPE, and extensive cleaning of exam room while observing appropriate contact time as indicated for disinfecting solutions.  Subjective:     Patient ID: Alan Walsh , male    DOB: 11-Apr-1966 , 55 y.o.   MRN: [id number]   Chief Complaint  Patient presents with  . Annual Exam    HPI  Patient here for hm.  His sister Doroteo Bradford) passed away on 2022/10/20.    Wt Readings from Last 3 Encounters: 11/19/20 : 201 lb 3.2 oz (91.3 kg) 04/07/20 : 207 lb 3.2 oz (94 kg) 10/03/19 : 229 lb 12.8 oz (104.2 kg)     Past Medical History:  Diagnosis Date  . G-6-PD deficiency      History reviewed. No pertinent family history.   Current Outpatient Medications:  .  acetaminophen (TYLENOL) 500 MG tablet, Take 1 tablet (500 mg total) by mouth 2 (two) times daily., Disp: 60 tablet, Rfl: 0 .  topiramate (TOPAMAX) 50 MG tablet, TAKE 1 TABLET BY MOUTH TWICE A DAY, Disp: 180 tablet, Rfl: 1 .  cyclobenzaprine (FLEXERIL) 10 MG tablet, Take 1 tablet (10 mg total) by mouth 3 (three) times daily as needed for muscle spasms., Disp: 30 tablet, Rfl: 0 .  gabapentin (NEURONTIN) 300 MG capsule, Take 2 tablets by mouth at bedtime, Disp: 180 capsule, Rfl: 1 .  HYDROcodone-acetaminophen (NORCO/VICODIN) 5-325 MG tablet, Take 1 tablet by mouth every 6 (six) hours as needed for moderate pain., Disp: 30 tablet, Rfl: 0   Allergies  Allergen Reactions  . Aspirin      Glucose-6-dehydrogenase deficiency.     Men's preventive visit. Patient Health Questionnaire (PHQ-2) is  [place] Office Visit from 11/19/2020 in [facility]  PHQ-2 Total Score 0     Patient is on a Regular diet.  Exercise minimal but has a strenuous job.  Marital status: Divorced. Relevant history for alcohol use is:  Social History   Substance and Sexual Activity  Alcohol Use No   Relevant history for tobacco use is:  Social History   Tobacco Use  Smoking Status Current Every Day Smoker  Smokeless Tobacco Never Used  Tobacco Comment   smokes a cigarette every now and then; denies smoking   .   Review of Systems  Constitutional: Negative.   HENT: Negative.   Eyes: Negative.   Respiratory: Negative.  Negative for cough.   Cardiovascular: Negative.  Negative for chest pain, palpitations and leg swelling.  Gastrointestinal: Negative.   Endocrine: Negative.   Genitourinary: Negative.   Musculoskeletal: Positive for arthralgias, back pain (chronic) and neck pain (chronic).  Skin: Negative.   Neurological: Negative.  Negative for dizziness and headaches.  Hematological: Negative.   Psychiatric/Behavioral: Negative.      Today's Vitals   11/19/20 0934  BP: 114/76  Pulse: 69  Temp: 97.7 F (36.5 C)  TempSrc: Oral  Weight: 201 lb 3.2 oz (91.3 kg)  Height: 5' 10.4" (1.788 m)  PainSc: 8   PainLoc: Back   Body mass index is 28.54 kg/m.   Objective:  Physical Exam Vitals reviewed.  Constitutional:      General: He is not  in acute distress.    Appearance: Normal appearance. He is obese.  HENT:     Head: Normocephalic and atraumatic.     Right Ear: Tympanic membrane, ear canal and external ear normal. There is no impacted cerumen.     Left Ear: Tympanic membrane, ear canal and external ear normal. There is no impacted cerumen.     Nose:     Comments: Deferred - masked     Mouth/Throat:     Comments: Deferred - masked Eyes:     Extraocular Movements: Extraocular movements intact.     Conjunctiva/sclera: Conjunctivae normal.     Pupils: Pupils are equal, round, and reactive to light.  Cardiovascular:      Rate and Rhythm: Normal rate and regular rhythm.     Pulses: Normal pulses.     Heart sounds: Normal heart sounds. No murmur heard.   Pulmonary:     Effort: Pulmonary effort is normal. No respiratory distress.     Breath sounds: Normal breath sounds. No wheezing.  Abdominal:     General: Abdomen is flat. Bowel sounds are normal. There is no distension.     Palpations: Abdomen is soft.  Genitourinary:    Comments: Deferred  Musculoskeletal:        General: No swelling, tenderness or deformity. Normal range of motion.     Cervical back: Normal range of motion and neck supple.     Right lower leg: No edema.     Left lower leg: No edema.  Skin:    General: Skin is warm and dry.     Capillary Refill: Capillary refill takes less than 2 seconds.     Coloration: Skin is not jaundiced.  Neurological:     General: No focal deficit present.     Mental Status: He is alert and oriented to person, place, and time.     Cranial Nerves: No cranial nerve deficit.     Motor: No weakness.  Psychiatric:        Mood and Affect: Mood normal.        Behavior: Behavior normal.        Thought Content: Thought content normal.        Judgment: Judgment normal.         Assessment And Plan:    1. Encounter for general adult medical examination w/o abnormal findings . Behavior modifications discussed and diet history reviewed.   . Pt will continue to exercise regularly and modify diet with low GI, plant based foods and decrease intake of processed foods.  . Recommend intake of daily multivitamin, Vitamin D, and calcium.  . Recommend colonoscopy (up to date) for preventive screenings, as well as recommend immunizations that include influenza, TDAP - CBC no Diff  2. Encounter for prostate cancer screening - CMP14+EGFR - PSA  3. Chronic neck pain  Chronic, muscle relaxer and will take occasional norco, has not had filled since 03/2020 - HYDROcodone-acetaminophen (NORCO/VICODIN) 5-325 MG tablet; Take  1 tablet by mouth every 6 (six) hours as needed for moderate pain.  Dispense: 30 tablet; Refill: 0     Patient was given opportunity to ask questions. Patient verbalized understanding of the plan and was able to repeat key elements of the plan. All questions were answered to their satisfaction.   Minette Brine, FNP   I, Minette Brine, FNP, have reviewed all documentation for this visit. The documentation on 11/19/20 for the exam, diagnosis, procedures, and orders are all accurate and complete.  THE  PATIENT IS ENCOURAGED TO PRACTICE SOCIAL DISTANCING DUE TO THE COVID-19 PANDEMIC.   

## 2020-11-19 NOTE — Patient Instructions (Signed)

## 2020-11-20 LAB — CMP14+EGFR
ALT: 9 IU/L (ref 0–44)
AST: 17 IU/L (ref 0–40)
Albumin/Globulin Ratio: 1.6 (ref 1.2–2.2)
Albumin: 4.3 g/dL (ref 3.8–4.9)
Alkaline Phosphatase: 96 IU/L (ref 44–121)
BUN/Creatinine Ratio: 14 (ref 9–20)
BUN: 17 mg/dL (ref 6–24)
Bilirubin Total: 0.4 mg/dL (ref 0.0–1.2)
CO2: 18 mmol/L — ABNORMAL LOW (ref 20–29)
Calcium: 8.8 mg/dL (ref 8.7–10.2)
Chloride: 108 mmol/L — ABNORMAL HIGH (ref 96–106)
Creatinine, Ser: 1.18 mg/dL (ref 0.76–1.27)
Globulin, Total: 2.7 g/dL (ref 1.5–4.5)
Glucose: 82 mg/dL (ref 65–99)
Potassium: 3.9 mmol/L (ref 3.5–5.2)
Sodium: 141 mmol/L (ref 134–144)
Total Protein: 7 g/dL (ref 6.0–8.5)
eGFR: 72 mL/min/{1.73_m2} (ref >59)

## 2020-11-20 LAB — CBC
Hematocrit: 37.2 % — ABNORMAL LOW (ref 37.5–51.0)
Hemoglobin: 12.2 g/dL — ABNORMAL LOW (ref 13.0–17.7)
MCH: 30.8 pg (ref 26.6–33.0)
MCHC: 32.8 g/dL (ref 31.5–35.7)
MCV: 94 fL (ref 79–97)
Platelets: 252 10*3/uL (ref 150–450)
RBC: 3.96 x10E6/uL — ABNORMAL LOW (ref 4.14–5.80)
RDW: 12.5 % (ref 11.6–15.4)
WBC: 7.6 10*3/uL (ref 3.4–10.8)

## 2020-11-20 LAB — PSA: Prostate Specific Ag, Serum: 0.5 ng/mL (ref 0.0–4.0)

## 2021-04-16 ENCOUNTER — Other Ambulatory Visit: Payer: Self-pay | Admitting: Nurse Practitioner

## 2021-04-16 DIAGNOSIS — R519 Headache, unspecified: Secondary | ICD-10-CM

## 2021-11-22 ENCOUNTER — Other Ambulatory Visit: Payer: Self-pay | Admitting: Nurse Practitioner

## 2021-11-22 DIAGNOSIS — R519 Headache, unspecified: Secondary | ICD-10-CM

## 2021-11-23 ENCOUNTER — Encounter: Payer: 59 | Admitting: Nurse Practitioner

## 2021-11-23 NOTE — Progress Notes (Signed)
No show

## 2021-11-23 NOTE — Patient Instructions (Signed)
Health Maintenance, Male Adopting a healthy lifestyle and getting preventive care are important in promoting health and wellness. Ask your health care provider about: The right schedule for you to have regular tests and exams. Things you can do on your own to prevent diseases and keep yourself healthy. What should I know about diet, weight, and exercise? Eat a healthy diet  Eat a diet that includes plenty of vegetables, fruits, low-fat dairy products, and lean protein. Do not eat a lot of foods that are high in solid fats, added sugars, or sodium. Maintain a healthy weight Body mass index (BMI) is a measurement that can be used to identify possible weight problems. It estimates body fat based on height and weight. Your health care provider can help determine your BMI and help you achieve or maintain a healthy weight. Get regular exercise Get regular exercise. This is one of the most important things you can do for your health. Most adults should: Exercise for at least 150 minutes each week. The exercise should increase your heart rate and make you sweat (moderate-intensity exercise). Do strengthening exercises at least twice a week. This is in addition to the moderate-intensity exercise. Spend less time sitting. Even light physical activity can be beneficial. Watch cholesterol and blood lipids Have your blood tested for lipids and cholesterol at 58 years of age, then have this test every 5 years. You may need to have your cholesterol levels checked more often if: Your lipid or cholesterol levels are high. You are older than 58 years of age. You are at high risk for heart disease. What should I know about cancer screening? Many types of cancers can be detected early and may often be prevented. Depending on your health history and family history, you may need to have cancer screening at various ages. This may include screening for: Colorectal cancer. Prostate cancer. Skin cancer. Lung  cancer. What should I know about heart disease, diabetes, and high blood pressure? Blood pressure and heart disease High blood pressure causes heart disease and increases the risk of stroke. This is more likely to develop in people who have high blood pressure readings or are overweight. Talk with your health care provider about your target blood pressure readings. Have your blood pressure checked: Every 3-5 years if you are 18-39 years of age. Every year if you are 40 years old or older. If you are between the ages of 65 and 75 and are a current or former smoker, ask your health care provider if you should have a one-time screening for abdominal aortic aneurysm (AAA). Diabetes Have regular diabetes screenings. This checks your fasting blood sugar level. Have the screening done: Once every three years after age 45 if you are at a normal weight and have a low risk for diabetes. More often and at a younger age if you are overweight or have a high risk for diabetes. What should I know about preventing infection? Hepatitis B If you have a higher risk for hepatitis B, you should be screened for this virus. Talk with your health care provider to find out if you are at risk for hepatitis B infection. Hepatitis C Blood testing is recommended for: Everyone born from 1945 through 1965. Anyone with known risk factors for hepatitis C. Sexually transmitted infections (STIs) You should be screened each year for STIs, including gonorrhea and chlamydia, if: You are sexually active and are younger than 58 years of age. You are older than 58 years of age and your   health care provider tells you that you are at risk for this type of infection. Your sexual activity has changed since you were last screened, and you are at increased risk for chlamydia or gonorrhea. Ask your health care provider if you are at risk. Ask your health care provider about whether you are at high risk for HIV. Your health care provider  may recommend a prescription medicine to help prevent HIV infection. If you choose to take medicine to prevent HIV, you should first get tested for HIV. You should then be tested every 3 months for as long as you are taking the medicine. Follow these instructions at home: Alcohol use Do not drink alcohol if your health care provider tells you not to drink. If you drink alcohol: Limit how much you have to 0-2 drinks a day. Know how much alcohol is in your drink. In the [place] one drink equals one 12 oz bottle of beer (355 mL), one 5 oz glass of wine (148 mL), or one 1 oz glass of hard liquor (44 mL). Lifestyle Do not use any products that contain nicotine or tobacco. These products include cigarettes, chewing tobacco, and vaping devices, such as e-cigarettes. If you need help quitting, ask your health care provider. Do not use street drugs. Do not share needles. Ask your health care provider for help if you need support or information about quitting drugs. General instructions Schedule regular health, dental, and eye exams. Stay current with your vaccines. Tell your health care provider if: You often feel depressed. You have ever been abused or do not feel safe at home. Summary Adopting a healthy lifestyle and getting preventive care are important in promoting health and wellness. Follow your health care provider's instructions about healthy diet, exercising, and getting tested or screened for diseases. Follow your health care provider's instructions on monitoring your cholesterol and blood pressure. This information is not intended to replace advice given to you by your health care provider. Make sure you discuss any questions you have with your health care provider. Document Revised: 11/23/2020 Document Reviewed: 11/23/2020 Elsevier Patient Education  2023 [employer]  

## 2022-05-24 ENCOUNTER — Other Ambulatory Visit: Payer: Self-pay | Admitting: Nurse Practitioner

## 2022-05-24 DIAGNOSIS — R519 Headache, unspecified: Secondary | ICD-10-CM

## 2023-04-26 ENCOUNTER — Ambulatory Visit (INDEPENDENT_AMBULATORY_CARE_PROVIDER_SITE_OTHER): Payer: Self-pay | Admitting: *Deleted

## 2023-04-26 NOTE — Telephone Encounter (Signed)
Summary: fall/knee pain   Pt's wife called in states, pt had a fall yesterday and has left knee pain         Chief Complaint: Fall Symptoms: Fell yesterday, left knee pain 9/10. Swelling "Always have that though." Frequency: yesterday Pertinent Negatives: Patient denies lacerations Disposition: [] ED /[x] Urgent Care (no appt availability in office) / [] Appointment(In office/virtual)/ []  [facility]/ [] Home Care/ [] Refused Recommended Disposition /[] [facility]/ []  Follow-up with PCP Additional Notes: Advised UC, states will follow disposition. Advised [facility]. No PCP. Care advise provided, pt verbalizes understanding.  Reason for Disposition  [1] No prior tetanus shots (or is not fully vaccinated) AND [2] any wound (e.g., cut or scrape)    Knee pain 9/10  Answer Assessment - Initial Assessment Questions 1. MECHANISM: "How did the fall happen?"     Knee gave out 2. DOMESTIC VIOLENCE AND ELDER ABUSE SCREENING: "Did you fall because someone pushed you or tried to hurt you?" If Yes, ask: "Are you safe now?"     No 3. ONSET: "When did the fall happen?" (e.g., minutes, hours, or days ago)     Yesterday 4. LOCATION: "What part of the body hit the ground?" (e.g., back, buttocks, head, hips, knees, hands, head, stomach)     "All of the body." 5. INJURY: "Did you hurt (injure) yourself when you fell?" If Yes, ask: "What did you injure? Tell me more about this?" (e.g., body area; type of injury; pain severity)"     Swelling 6. PAIN: "Is there any pain?" If Yes, ask: "How bad is the pain?" (e.g., Scale 1-10; or mild,  moderate, severe)   - NONE (0): No pain   - MILD (1-3): Doesn't interfere with normal activities    - MODERATE (4-7): Interferes with normal activities or awakens from sleep    - SEVERE (8-10): Excruciating pain, unable to do any normal activities      Left knee, swelling.  9/10 7. SIZE: For cuts, bruises, or swelling, ask: "How large is it?" (e.g.,  inches or centimeters)     No   9. OTHER SYMPTOMS: "Do you have any other symptoms?" (e.g., dizziness, fever, weakness; new onset or worsening).      No 10. CAUSE: "What do you think caused the fall (or falling)?" (e.g., tripped, dizzy spell)       Knee gave out  Protocols used: Falls and [facility]

## 2023-05-01 DIAGNOSIS — M25562 Pain in left knee: Secondary | ICD-10-CM | POA: Diagnosis not present

## 2023-05-01 DIAGNOSIS — M25561 Pain in right knee: Secondary | ICD-10-CM | POA: Diagnosis not present

## 2023-05-19 ENCOUNTER — Emergency Department (HOSPITAL_COMMUNITY): Payer: 59

## 2023-05-19 ENCOUNTER — Encounter (HOSPITAL_COMMUNITY): Payer: Self-pay | Admitting: Emergency Medicine

## 2023-05-19 ENCOUNTER — Emergency Department (HOSPITAL_COMMUNITY)
Admission: EM | Admit: 2023-05-19 | Discharge: 2023-05-20 | Disposition: A | Discharge status: Home / Self Care | Payer: 59 | Attending: Emergency Medicine | Admitting: Emergency Medicine

## 2023-05-19 ENCOUNTER — Other Ambulatory Visit: Payer: Self-pay

## 2023-05-19 DIAGNOSIS — M25461 Effusion, right knee: Secondary | ICD-10-CM | POA: Diagnosis not present

## 2023-05-19 DIAGNOSIS — M7989 Other specified soft tissue disorders: Secondary | ICD-10-CM | POA: Diagnosis not present

## 2023-05-19 DIAGNOSIS — M85861 Other specified disorders of bone density and structure, right lower leg: Secondary | ICD-10-CM | POA: Diagnosis not present

## 2023-05-19 DIAGNOSIS — M25561 Pain in right knee: Secondary | ICD-10-CM | POA: Diagnosis not present

## 2023-05-19 DIAGNOSIS — M1711 Unilateral primary osteoarthritis, right knee: Secondary | ICD-10-CM | POA: Diagnosis not present

## 2023-05-19 LAB — COMPREHENSIVE METABOLIC PANEL
ALT: 19 U/L (ref 0–44)
AST: 22 U/L (ref 15–41)
Albumin: 4 g/dL (ref 3.5–5.0)
Alkaline Phosphatase: 74 U/L (ref 38–126)
Anion gap: 13 (ref 5–15)
BUN: 18 mg/dL (ref 6–20)
CO2: 21 mmol/L — ABNORMAL LOW (ref 22–32)
Calcium: 9.3 mg/dL (ref 8.9–10.3)
Chloride: 100 mmol/L (ref 98–111)
Creatinine, Ser: 1.26 mg/dL — ABNORMAL HIGH (ref 0.61–1.24)
GFR, Estimated: >60 mL/min (ref >60)
Glucose, Bld: 108 mg/dL — ABNORMAL HIGH (ref 70–99)
Potassium: 3.2 mmol/L — ABNORMAL LOW (ref 3.5–5.1)
Sodium: 134 mmol/L — ABNORMAL LOW (ref 135–145)
Total Bilirubin: 0.5 mg/dL (ref 0.3–1.2)
Total Protein: 7.4 g/dL (ref 6.5–8.1)

## 2023-05-19 LAB — CBC WITH DIFFERENTIAL/PLATELET
Abs Immature Granulocytes: 0.04 10*3/uL (ref 0.00–0.07)
Basophils Absolute: 0.1 10*3/uL (ref 0.0–0.1)
Basophils Relative: 1 %
Eosinophils Absolute: 0.4 10*3/uL (ref 0.0–0.5)
Eosinophils Relative: 3 %
HCT: 37.5 % — ABNORMAL LOW (ref 39.0–52.0)
Hemoglobin: 12.2 g/dL — ABNORMAL LOW (ref 13.0–17.0)
Immature Granulocytes: 0 %
Lymphocytes Relative: 19 %
Lymphs Abs: 2.4 10*3/uL (ref 0.7–4.0)
MCH: 32.3 pg (ref 26.0–34.0)
MCHC: 32.5 g/dL (ref 30.0–36.0)
MCV: 99.2 fL (ref 80.0–100.0)
Monocytes Absolute: 1.5 10*3/uL — ABNORMAL HIGH (ref 0.1–1.0)
Monocytes Relative: 12 %
Neutro Abs: 7.9 10*3/uL — ABNORMAL HIGH (ref 1.7–7.7)
Neutrophils Relative %: 65 %
Platelets: 255 10*3/uL (ref 150–400)
RBC: 3.78 MIL/uL — ABNORMAL LOW (ref 4.22–5.81)
RDW: 14.1 % (ref 11.5–15.5)
WBC: 12.2 10*3/uL — ABNORMAL HIGH (ref 4.0–10.5)
nRBC: 0 % (ref 0.0–0.2)

## 2023-05-19 LAB — C-REACTIVE PROTEIN: CRP: 0.7 mg/dL (ref <1.0)

## 2023-05-19 MED ORDER — ONDANSETRON 4 MG PO TBDP
4.0000 mg | ORAL_TABLET | Freq: Once | ORAL | Status: AC
  Administered 2023-05-19: 4 mg via ORAL
  Filled 2023-05-19: qty 1

## 2023-05-19 MED ORDER — HYDROCODONE-ACETAMINOPHEN 5-325 MG PO TABS
2.0000 | ORAL_TABLET | Freq: Once | ORAL | Status: AC
  Administered 2023-05-19: 2 via ORAL
  Filled 2023-05-19: qty 2

## 2023-05-19 NOTE — ED Provider Triage Note (Signed)
Emergency Medicine Provider Triage Evaluation Note  Alan Walsh , a 57 y.o. male  was evaluated in triage.  Pt complains of severe knee pain on the R. Had both knees tapped on Monday. Things were better until yesterdayu when his R knee swelled and became severely painful. Now cannot bear weight.  Review of Systems  Positive: Knee pain  Negative: fever  Physical Exam  BP (!) 113/95   Pulse 96   Temp 98.5 F (36.9 C) (Oral)   Resp 18   SpO2 97%  Gen:   Awake, no distress   Resp:  Normal effort  MSK:   Moves extremities without difficulty  Other:  R knee warm and swollen   Medical Decision Making  Medically screening exam initiated at 8:38 PM.  Appropriate orders placed.  Tawni Carnes was informed that the remainder of the evaluation will be completed by another provider, this initial triage assessment does not replace that evaluation, and the importance of remaining in the ED until their evaluation is complete.  Need r/o septic joint   Arthor Captain, PA-C 05/19/23 2041

## 2023-05-19 NOTE — ED Triage Notes (Signed)
Pt reports getting his knee drained and getting Cortizone shorts in knee. Pt reports throbbing pain and inability to walk on right knee.

## 2023-05-20 DIAGNOSIS — M25461 Effusion, right knee: Secondary | ICD-10-CM | POA: Diagnosis not present

## 2023-05-20 LAB — SYNOVIAL CELL COUNT + DIFF, W/ CRYSTALS
Crystals, Fluid: NONE SEEN
Eosinophils-Synovial: 3 % — ABNORMAL HIGH (ref 0–1)
Lymphocytes-Synovial Fld: 1 % (ref 0–20)
Monocyte-Macrophage-Synovial Fluid: 22 % — ABNORMAL LOW (ref 50–90)
Neutrophil, Synovial: 74 % — ABNORMAL HIGH (ref 0–25)
WBC, Synovial: 25320 /mm3 — ABNORMAL HIGH (ref 0–200)

## 2023-05-20 MED ORDER — PREDNISONE 10 MG (21) PO TBPK: ORAL_TABLET | Freq: Every day | ORAL | 0 refills | Status: DC

## 2023-05-20 MED ORDER — OXYCODONE-ACETAMINOPHEN 5-325 MG PO TABS
1.0000 | ORAL_TABLET | Freq: Once | ORAL | Status: AC
  Administered 2023-05-20: 1 via ORAL
  Filled 2023-05-20: qty 1

## 2023-05-20 MED ORDER — LIDOCAINE HCL (PF) 1 % IJ SOLN
10.0000 mL | Freq: Once | INTRAMUSCULAR | Status: AC
  Administered 2023-05-20: 10 mL
  Filled 2023-05-20: qty 30

## 2023-05-20 MED ORDER — HYDROMORPHONE HCL 1 MG/ML IJ SOLN
1.0000 mg | Freq: Once | INTRAMUSCULAR | Status: DC
  Filled 2023-05-20: qty 1

## 2023-05-20 MED ORDER — OXYCODONE-ACETAMINOPHEN 5-325 MG PO TABS: 1.0000 | ORAL_TABLET | Freq: Four times a day (QID) | ORAL | 0 refills | Status: DC | PRN

## 2023-05-20 MED ORDER — HYDROMORPHONE HCL 1 MG/ML IJ SOLN
1.0000 mg | Freq: Once | INTRAMUSCULAR | Status: AC
  Administered 2023-05-20: 1 mg via SUBCUTANEOUS

## 2023-05-20 NOTE — ED Provider Notes (Signed)
[place] EMERGENCY DEPARTMENT AT [facility] Provider Note   CSN: [id number] Arrival date & time: 05/19/23  2004     History  Chief Complaint  Patient presents with   Knee Pain    Alan Walsh is a 57 y.o. male, history of iritis, who presents to the ED secondary to right knee swelling is being going on for the last day.  He states he had his knee tapped, by hip surgeon, last Monday, 4 days ago, and he was doing well initially, but today he woke up and his knee was red, swollen, hot to the touch, and he has not been able to hardly walk on it.  His states that he attempted to bear weight today, and almost fell.  Notes that he did also get a cortisone shot in both knees, but same orthopedic doctor that 4 days ago, but the left knee is not bothering him at all.  Denies any fevers, chills.  Home Medications Prior to Admission medications   Medication Sig Start Date End Date Taking? Authorizing Provider  acetaminophen (TYLENOL) 500 MG tablet Take 1 tablet (500 mg total) by mouth 2 (two) times daily. 10/03/19  Yes Arnette Felts, FNP  cyclobenzaprine (FLEXERIL) 10 MG tablet Take 1 tablet (10 mg total) by mouth 3 (three) times daily as needed for muscle spasms. 11/19/20  Yes Arnette Felts, FNP  gabapentin (NEURONTIN) 300 MG capsule Take 2 tablets by mouth at bedtime 11/19/20  Yes Arnette Felts, FNP  HYDROcodone-acetaminophen (NORCO/VICODIN) 5-325 MG tablet Take 1 tablet by mouth every 6 (six) hours as needed for moderate pain. 11/19/20  Yes Arnette Felts, FNP  oxyCODONE-acetaminophen (PERCOCET/ROXICET) 5-325 MG tablet Take 1 tablet by mouth every 6 (six) hours as needed for severe pain (pain score 7-10). 05/20/23  Yes Asami Lambright L, PA  predniSONE (STERAPRED UNI-PAK 21 TAB) 10 MG (21) TBPK tablet Take by mouth daily. Take 6 tabs by mouth daily  for 2 days, then 5 tabs for 2 days, then 4 tabs for 2 days, then 3 tabs for 2 days, 2 tabs for 2 days, then 1 tab by mouth daily for 2 days 05/20/23   Yes Lucion Dilger L, PA  topiramate (TOPAMAX) 50 MG tablet TAKE 1 TABLET BY MOUTH TWICE A DAY 11/22/21  Yes Arnette Felts, FNP      Allergies    Aspirin    Review of Systems   Review of Systems  Constitutional:  Negative for fever.    Physical Exam Updated Vital Signs BP 117/89   Pulse 70   Temp 98.1 F (36.7 C) (Oral)   Resp 16   SpO2 95%  Physical Exam Vitals and nursing note reviewed.  Constitutional:      General: He is not in acute distress.    Appearance: He is well-developed.  HENT:     Head: Normocephalic and atraumatic.  Eyes:     General:        Right eye: No discharge.        Left eye: No discharge.     Conjunctiva/sclera: Conjunctivae normal.  Pulmonary:     Effort: No respiratory distress.  Musculoskeletal:     Comments: Right Knee: Global tenderness to palpation of the knee, with slight confusion.  Hot, and swollen.  Puncture mark, noted to the lateral border of the knee..  Difficulty with extension and flexion, limited range of motion.  No sensory deficits.    Neurological:     Mental Status: He is alert.  Comments: Clear speech.   Psychiatric:        Behavior: Behavior normal.        Thought Content: Thought content normal.     ED Results / Procedures / Treatments   Labs (all labs ordered are listed, but only abnormal results are displayed) Labs Reviewed  COMPREHENSIVE METABOLIC PANEL - Abnormal; Notable for the following components:      Result Value   Sodium 134 (*)    Potassium 3.2 (*)    CO2 21 (*)    Glucose, Bld 108 (*)    Creatinine, Ser 1.26 (*)    All other components within normal limits  CBC WITH DIFFERENTIAL/PLATELET - Abnormal; Notable for the following components:   WBC 12.2 (*)    RBC 3.78 (*)    Hemoglobin 12.2 (*)    HCT 37.5 (*)    Neutro Abs 7.9 (*)    Monocytes Absolute 1.5 (*)    All other components within normal limits  SYNOVIAL CELL COUNT + DIFF, W/ CRYSTALS - Abnormal; Notable for the following components:    Appearance-Synovial HAZY (*)    WBC, Synovial 25,320 (*)    Neutrophil, Synovial 74 (*)    Monocyte-Macrophage-Synovial Fluid 22 (*)    Eosinophils-Synovial 3 (*)    All other components within normal limits  BODY FLUID CULTURE W GRAM STAIN  GRAM STAIN  C-REACTIVE PROTEIN  GLUCOSE, BODY FLUID OTHER            SEDIMENTATION RATE    EKG None  Radiology DG Knee Complete 4 Views Right  Result Date: 05/19/2023 CLINICAL DATA:  Right knee pain. EXAM: RIGHT KNEE - COMPLETE 4+ VIEW COMPARISON:  None Available. FINDINGS: There is no acute fracture or dislocation. The bones are mildly osteopenic. There is moderate arthritic changes of the knee with tricompartmental narrowing and spurring. There is a large joint effusion. A Cameran Pettey calcific focus in the suprapatellar effusion may represent loose body. The soft tissues are unremarkable. IMPRESSION: 1. No acute fracture or dislocation. 2. Moderate arthritic changes of the knee. 3. Large joint effusion. Electronically Signed   By: Elgie Collard M.D.   On: 05/19/2023 22:05    Procedures .Joint Aspiration/Arthrocentesis  Date/Time: 05/20/2023 2:23 AM  Performed by: Pete Pelt, PA Authorized by: Pete Pelt, PA   Consent:    Consent given by:  Patient   Risks, benefits, and alternatives were discussed: yes     Risks discussed:  Bleeding, incomplete drainage, nerve damage, infection, pain and poor cosmetic result   Alternatives discussed:  No treatment Universal protocol:    Test results available: yes     Imaging studies available: yes     Site/side marked: yes     Immediately prior to procedure, a time out was called: yes     Patient identity confirmed:  Verbally with patient Location:    Location:  Knee   Knee:  R knee Anesthesia:    Anesthesia method:  Local infiltration   Local anesthetic:  Lidocaine 1% w/o epi Procedure details:    Needle gauge:  18 G   Ultrasound guidance: no     Approach:  Superior   Aspirate amount:   20   Aspirate characteristics:  Cloudy   Steroid injected: no     Specimen collected: yes   Post-procedure details:    Dressing:  Sterile dressing   Procedure completion:  Tolerated     Medications Ordered in ED Medications  oxyCODONE-acetaminophen (PERCOCET/ROXICET) 5-325 MG per  tablet 1 tablet (has no administration in time range)  HYDROcodone-acetaminophen (NORCO/VICODIN) 5-325 MG per tablet 2 tablet (2 tablets Oral Given 05/19/23 2046)  ondansetron (ZOFRAN-ODT) disintegrating tablet 4 mg (4 mg Oral Given 05/19/23 2046)  lidocaine (PF) (XYLOCAINE) 1 % injection 10 mL (10 mLs Infiltration Given by Other 05/20/23 0227)  HYDROmorphone (DILAUDID) injection 1 mg (1 mg Subcutaneous Given 05/20/23 0053)    ED Course/ Medical Decision Making/ A&P                                 Medical Decision Making Patient is a 57 year old male, here for right knee pain, has been going on for the last day.  States it is hot, and difficult to bend, states that it is painful, had it tapped about 4 days prior, by a hip surgeon, at [facility] per patient, and was not having pain initially, but then developed it on 11/1.  On exam, the knee is non-erythematous, swollen, and hot to the touch.  Patient will need to have knee, I tapped the knee, cloudy fluid, that was straw-colored came out.  It was sent off for culture, as well as cell count.  Amount and/or Complexity of Data Reviewed Labs: ordered.    Details: White blood cell count of the synovial fluid 25, 320, neutrophil count 74, leukocytosis of 12.2, for CBC Radiology:     Details: Large joint effusion, moderate arthritic changes of the knee Discussion of management or test interpretation with external provider(s): Discussed with patient, he had a little bit of relief after I removed some fluid, apologize for the long wait, synovial fluid white blood cell count, 25,000, neutrophil count 74, I discussed this with Dr. Carola Frost, on-call for [facility], and he  recommended the patient be put on a prednisone Dosepak, as well as follow-up with orthopedics.  Low suspicion for septic joint, per Dr. Carola Frost.  Recommended culture be sent.  Discussed with patient, he is in agreement with plan, return if symptoms worsen.  I sent a prednisone Dosepak, as well as have Percocets to the pharmacy for him, and provided him with crutches.  He is afebrile and overall well-appearing at this time  Risk Prescription drug management.    Final Clinical Impression(s) / ED Diagnoses Final diagnoses:  Effusion of right knee    Rx / DC Orders ED Discharge Orders          Ordered    oxyCODONE-acetaminophen (PERCOCET/ROXICET) 5-325 MG tablet  Every 6 hours PRN        05/20/23 0643    predniSONE (STERAPRED UNI-PAK 21 TAB) 10 MG (21) TBPK tablet  Daily        05/20/23 0643              Evora Schechter, Harley Alto, PA 05/20/23 0865    Nira Conn, MD 05/20/23 [phone]

## 2023-05-20 NOTE — Discharge Instructions (Addendum)
Please follow-up with [facility], I have sent you a steroid Dosepak, as well as some Percocets for your pain control.  Use the crutches as needed.  If the area becomes more red, swollen, tender please return to the ER.  Your initial  labs do not appear that it is infected, however we are sending a culture, if we call you in 3 days, you may need to return, if there is bacteria in the fluid.

## 2023-05-23 LAB — BODY FLUID CULTURE W GRAM STAIN: Culture: NO GROWTH

## 2023-05-23 LAB — GLUCOSE, BODY FLUID OTHER: Glucose, Body Fluid Other: 3 mg/dL

## 2023-05-24 ENCOUNTER — Encounter (HOSPITAL_COMMUNITY): Payer: Self-pay

## 2023-05-24 ENCOUNTER — Emergency Department (HOSPITAL_COMMUNITY)
Admission: EM | Admit: 2023-05-24 | Discharge: 2023-05-25 | Disposition: A | Discharge status: Home / Self Care | Payer: 59 | Attending: Emergency Medicine | Admitting: Emergency Medicine

## 2023-05-24 DIAGNOSIS — M25461 Effusion, right knee: Secondary | ICD-10-CM | POA: Diagnosis not present

## 2023-05-24 DIAGNOSIS — D72829 Elevated white blood cell count, unspecified: Secondary | ICD-10-CM | POA: Insufficient documentation

## 2023-05-24 DIAGNOSIS — M7989 Other specified soft tissue disorders: Secondary | ICD-10-CM | POA: Diagnosis not present

## 2023-05-24 LAB — COMPREHENSIVE METABOLIC PANEL
ALT: 14 U/L (ref 0–44)
AST: 14 U/L — ABNORMAL LOW (ref 15–41)
Albumin: 3.8 g/dL (ref 3.5–5.0)
Alkaline Phosphatase: 73 U/L (ref 38–126)
Anion gap: 7 (ref 5–15)
BUN: 15 mg/dL (ref 6–20)
CO2: 24 mmol/L (ref 22–32)
Calcium: 9.1 mg/dL (ref 8.9–10.3)
Chloride: 106 mmol/L (ref 98–111)
Creatinine, Ser: 1.04 mg/dL (ref 0.61–1.24)
GFR, Estimated: >60 mL/min (ref >60)
Glucose, Bld: 109 mg/dL — ABNORMAL HIGH (ref 70–99)
Potassium: 3.7 mmol/L (ref 3.5–5.1)
Sodium: 137 mmol/L (ref 135–145)
Total Bilirubin: 0.4 mg/dL (ref <1.2)
Total Protein: 7.3 g/dL (ref 6.5–8.1)

## 2023-05-24 LAB — CBC WITH DIFFERENTIAL/PLATELET
Abs Immature Granulocytes: 0.04 10*3/uL (ref 0.00–0.07)
Basophils Absolute: 0 10*3/uL (ref 0.0–0.1)
Basophils Relative: 0 %
Eosinophils Absolute: 0 10*3/uL (ref 0.0–0.5)
Eosinophils Relative: 0 %
HCT: 35 % — ABNORMAL LOW (ref 39.0–52.0)
Hemoglobin: 11.5 g/dL — ABNORMAL LOW (ref 13.0–17.0)
Immature Granulocytes: 0 %
Lymphocytes Relative: 13 %
Lymphs Abs: 1.4 10*3/uL (ref 0.7–4.0)
MCH: 32 pg (ref 26.0–34.0)
MCHC: 32.9 g/dL (ref 30.0–36.0)
MCV: 97.5 fL (ref 80.0–100.0)
Monocytes Absolute: 0.7 10*3/uL (ref 0.1–1.0)
Monocytes Relative: 6 %
Neutro Abs: 8.9 10*3/uL — ABNORMAL HIGH (ref 1.7–7.7)
Neutrophils Relative %: 81 %
Platelets: 328 10*3/uL (ref 150–400)
RBC: 3.59 MIL/uL — ABNORMAL LOW (ref 4.22–5.81)
RDW: 14.4 % (ref 11.5–15.5)
WBC: 11.1 10*3/uL — ABNORMAL HIGH (ref 4.0–10.5)
nRBC: 0 % (ref 0.0–0.2)

## 2023-05-24 MED ORDER — MORPHINE SULFATE 30 MG PO TABS: 15.0000 mg | ORAL_TABLET | Freq: Four times a day (QID) | ORAL | 0 refills | Status: DC | PRN

## 2023-05-24 MED ORDER — OXYCODONE-ACETAMINOPHEN 5-325 MG PO TABS
1.0000 | ORAL_TABLET | Freq: Once | ORAL | Status: AC
  Administered 2023-05-24: 1 via ORAL
  Filled 2023-05-24: qty 1

## 2023-05-24 NOTE — ED Provider Notes (Signed)
[place] EMERGENCY DEPARTMENT AT [facility] Provider Note   CSN: [id number] Arrival date & time: 05/24/23  1818     History  Chief Complaint  Patient presents with   Leg Pain   Leg Swelling    Alan Walsh is a 57 y.o. male with overall noncontributory past medical history who presents concern for pain and swelling in right leg/knee for several days.  He reports pain 8/10, reports that he had some slow change, swelling, seen by Ortho on 10/28, had cortisone shot and fluid removed.  Seen back at [facility] back on November 1 and had more fluid removed.  Patient reports that swelling and pain have worsened after some initial relief.  He has been taking oral prednisone as prescribed.  Denies any fever, chills, drainage from the site.   Leg Pain      Home Medications Prior to Admission medications   Medication Sig Start Date End Date Taking? Authorizing Provider  morphine (MSIR) 30 MG tablet Take 0.5-1 tablets (15-30 mg total) by mouth every 6 (six) hours as needed for severe pain (pain score 7-10). 05/24/23  Yes Sahirah Rudell H, PA-C  acetaminophen (TYLENOL) 500 MG tablet Take 1 tablet (500 mg total) by mouth 2 (two) times daily. 10/03/19   Arnette Felts, FNP  cyclobenzaprine (FLEXERIL) 10 MG tablet Take 1 tablet (10 mg total) by mouth 3 (three) times daily as needed for muscle spasms. 11/19/20   Arnette Felts, FNP  gabapentin (NEURONTIN) 300 MG capsule Take 2 tablets by mouth at bedtime 11/19/20   Arnette Felts, FNP  predniSONE (STERAPRED UNI-PAK 21 TAB) 10 MG (21) TBPK tablet Take by mouth daily. Take 6 tabs by mouth daily  for 2 days, then 5 tabs for 2 days, then 4 tabs for 2 days, then 3 tabs for 2 days, 2 tabs for 2 days, then 1 tab by mouth daily for 2 days 05/20/23   Small, Brooke L, PA  topiramate (TOPAMAX) 50 MG tablet TAKE 1 TABLET BY MOUTH TWICE A DAY 11/22/21   Arnette Felts, FNP      Allergies    Aspirin    Review of Systems   Review of Systems  All other  systems reviewed and are negative.   Physical Exam Updated Vital Signs BP (!) 124/92   Pulse 80   Temp 98.7 F (37.1 C) (Oral)   Resp 16   Ht 6\' 1"  (1.854 m)   Wt 104.3 kg   SpO2 100%   BMI 30.34 kg/m  Physical Exam Vitals and nursing note reviewed.  Constitutional:      General: He is not in acute distress.    Appearance: Normal appearance.  HENT:     Head: Normocephalic and atraumatic.  Eyes:     General:        Right eye: No discharge.        Left eye: No discharge.  Cardiovascular:     Rate and Rhythm: Normal rate and regular rhythm.  Pulmonary:     Effort: Pulmonary effort is normal. No respiratory distress.  Musculoskeletal:        General: Swelling present. No deformity.     Comments: Decreased strength, range of motion secondary to swelling, pain  Skin:    General: Skin is warm and dry.     Comments: No overlying skin redness, induration, no purulent drainage.  Neurological:     Mental Status: He is alert and oriented to person, place, and time.  Psychiatric:  Mood and Affect: Mood normal.        Behavior: Behavior normal.     ED Results / Procedures / Treatments   Labs (all labs ordered are listed, but only abnormal results are displayed) Labs Reviewed  CBC WITH DIFFERENTIAL/PLATELET - Abnormal; Notable for the following components:      Result Value   WBC 11.1 (*)    RBC 3.59 (*)    Hemoglobin 11.5 (*)    HCT 35.0 (*)    Neutro Abs 8.9 (*)    All other components within normal limits  COMPREHENSIVE METABOLIC PANEL - Abnormal; Notable for the following components:   Glucose, Bld 109 (*)    AST 14 (*)    All other components within normal limits  SEDIMENTATION RATE    EKG None  Radiology No results found.  Procedures Procedures    Medications Ordered in ED Medications  oxyCODONE-acetaminophen (PERCOCET/ROXICET) 5-325 MG per tablet 1 tablet (1 tablet Oral Given 05/24/23 2301)    ED Course/ Medical Decision Making/ A&P                                  Medical Decision Making Amount and/or Complexity of Data Reviewed Labs: ordered.   This patient is a 57 y.o. male  who presents to the ED for concern of knee pain, swelling.   Differential diagnoses prior to evaluation: The emergent differential diagnosis includes, but is not limited to,  Collateral ligament sprain vs. rupture, meniscus injury, fracture, dislocation, gout, pseudogout, bursitis, Septic arthritis, cellulitis, versus other musculoskeletal pain, injury . This is not an exhaustive differential.   Past Medical History / Co-morbidities / Social History: History of chronic pain medicine usage secondary to previous neck injury, neck surgery.  Additional history: Chart reviewed. Pertinent results include: Reviewed lab work, imaging, as well as joint fluid results from his previous emergency department visits, outpatient orthopedic visit.  Physical Exam: Physical exam performed. The pertinent findings include: Decreased strength, range of motion secondary to swelling, pain  No overlying skin redness, induration, no purulent drainage.  Lab Tests/Imaging studies: I personally interpreted labs/imaging and the pertinent results include: CBC with mild leukocytosis, white blood cells 11.1, given that he is currently taking steroids, this is a reasonable elevation.  CMP unremarkable.  Mild anemia, hemoglobin 11.5..     Medications: I ordered medication including Percocet for pain.  I have reviewed the patients home medicines and have made adjustments as needed.  Consults: Spoke with Dr. Victorino Dike with orthopedics who based on description of patient's physical exam, lab work, and history of 2 recent joint taps, would not recommend any emergent procedure, thinks he is reasonable to follow-up with orthopedics on Monday as planned.  Will discharge with plan for close follow-up, increased pain medication patient reports that he is not having any relief, not sleeping  secondary to pain.   Disposition:  After consideration of the diagnostic results and the patients response to treatment, I feel that patient stable for discharge with plan to follow-up as discussed above.   emergency department workup does not suggest an emergent condition requiring admission or immediate intervention beyond what has been performed at this time. The plan is: as above. The patient is safe for discharge and has been instructed to return immediately for worsening symptoms, change in symptoms or any other concerns.  Final Clinical Impression(s) / ED Diagnoses Final diagnoses:  Effusion of right knee  Rx / DC Orders ED Discharge Orders          Ordered    morphine (MSIR) 30 MG tablet  Every 6 hours PRN        05/24/23 2333              [place] 05/24/23 2340    Tegeler, Canary Brim, MD 05/24/23 2342

## 2023-05-24 NOTE — Discharge Instructions (Signed)
Please use Tylenol or ibuprofen for pain.  You may use 600 mg ibuprofen every 6 hours or 1000 mg of Tylenol every 6 hours.  You may choose to alternate between the 2.  This would be most effective.  Not to exceed 4 g of Tylenol within 24 hours.  Not to exceed 3200 mg ibuprofen 24 hours.  You can use the stronger narcotic pain medication in addition to the above for severe breakthrough pain.  Please follow-up closely with the orthopedic physician.  Please return if you have significant new redness, develop fever, or noticed pus draining from the knee.

## 2023-05-24 NOTE — ED Triage Notes (Addendum)
Pt c/o pain and swelling in R leg/knee x several days.  Pain score 8/10.  Pt reports being seen by ortho on 10/28 and had a cortisone shot and fluid removed.  Pt was seen at [facility] on 11/1 and had more fluid removed.  Pt reports he has not gotten any results from the fluid draw.    Swelling noted.   Pt reports he has been taking prednisone as prescribed.

## 2023-05-25 NOTE — ED Notes (Signed)
Discharge instructions reviewed.   Opportunity for questions and concerns provided.   Newly prescribed medications discussed. Pharmacy verified.   Follow ups provided.

## 2023-05-29 DIAGNOSIS — M545 Low back pain, unspecified: Secondary | ICD-10-CM | POA: Diagnosis not present

## 2023-05-29 DIAGNOSIS — M1711 Unilateral primary osteoarthritis, right knee: Secondary | ICD-10-CM | POA: Diagnosis not present

## 2023-05-31 DIAGNOSIS — R5382 Chronic fatigue, unspecified: Secondary | ICD-10-CM | POA: Diagnosis not present

## 2023-05-31 DIAGNOSIS — M25551 Pain in right hip: Secondary | ICD-10-CM | POA: Diagnosis not present

## 2023-05-31 DIAGNOSIS — Z6829 Body mass index (BMI) 29.0-29.9, adult: Secondary | ICD-10-CM | POA: Diagnosis not present

## 2023-05-31 DIAGNOSIS — M25561 Pain in right knee: Secondary | ICD-10-CM | POA: Diagnosis not present

## 2023-05-31 DIAGNOSIS — E663 Overweight: Secondary | ICD-10-CM | POA: Diagnosis not present

## 2023-07-06 ENCOUNTER — Ambulatory Visit (INDEPENDENT_AMBULATORY_CARE_PROVIDER_SITE_OTHER): Payer: 59 | Admitting: Primary Care

## 2023-07-06 ENCOUNTER — Encounter (INDEPENDENT_AMBULATORY_CARE_PROVIDER_SITE_OTHER): Payer: Self-pay | Admitting: Primary Care

## 2023-07-06 VITALS — BP 137/97 | HR 103 | Resp 16 | Ht 73.0 in | Wt 236.0 lb

## 2023-07-06 DIAGNOSIS — Z2821 Immunization not carried out because of patient refusal: Secondary | ICD-10-CM

## 2023-07-06 DIAGNOSIS — F17210 Nicotine dependence, cigarettes, uncomplicated: Secondary | ICD-10-CM | POA: Diagnosis not present

## 2023-07-06 DIAGNOSIS — E669 Obesity, unspecified: Secondary | ICD-10-CM | POA: Diagnosis not present

## 2023-07-06 DIAGNOSIS — E66811 Obesity, class 1: Secondary | ICD-10-CM

## 2023-07-06 DIAGNOSIS — Z6831 Body mass index (BMI) 31.0-31.9, adult: Secondary | ICD-10-CM | POA: Diagnosis not present

## 2023-07-06 DIAGNOSIS — Z7689 Persons encountering health services in other specified circumstances: Secondary | ICD-10-CM | POA: Diagnosis not present

## 2023-07-06 NOTE — Progress Notes (Signed)
New Patient Office Visit  Subjective    Patient ID: Alan Walsh male  DOB: 1965/12/17  Age: 57 y.o. MRN: [id number]   CC:   Return to work  HPI  Mr.  Alan Walsh presents in to establish care and followed by rheumatology. Requesting a return to work note. Contacted the rheumatologist office they have filled out every paper needed for him to be out of work. Placed out of work until January  30,2024 . He has not received any money since out of work and it is finacially difficult. Explained who took him out of work must return him . Spoke with [facility] Khushi Zupko LALAMA  and explained the situation. There office will call him and provide work note if she feels he is ready to return to work. He\has a history of mild osteoarthritis. Bp is elevated at this visited reviewed previous d/w monitoring sodium intake.  Current Outpatient Medications on File Prior to Visit  Medication Sig Dispense Refill   acetaminophen (TYLENOL) 500 MG tablet Take 1 tablet (500 mg total) by mouth 2 (two) times daily. 60 tablet 0   B-D TB SYRINGE 1CC/27GX1/2" 27G X 1/2" 1 ML MISC 27G 1/2" SQ WEEKLY WITH METHOTREXATE 90 DAYS     cyclobenzaprine (FLEXERIL) 10 MG tablet Take 1 tablet (10 mg total) by mouth 3 (three) times daily as needed for muscle spasms. 30 tablet 0   folic acid (FOLVITE) 1 MG tablet Take 1 mg by mouth daily.     gabapentin (NEURONTIN) 300 MG capsule Take 2 tablets by mouth at bedtime 180 capsule 1   methotrexate 50 MG/2ML injection SMARTSIG:injection injection Once a Week     predniSONE (DELTASONE) 10 MG tablet Take by mouth.     topiramate (TOPAMAX) 50 MG tablet TAKE 1 TABLET BY MOUTH TWICE A DAY 180 tablet 1   No current facility-administered medications on file prior to visit.     Allergies  Allergen Reactions   Aspirin      Glucose-6-dehydrogenase deficiency.    Past Medical History:  Diagnosis Date   G-6-PD deficiency      Past Surgical History:  Procedure Laterality Date   NECK  SURGERY       No family history on file.  Social History   Socioeconomic History   Marital status: Divorced    Spouse name: Not on file   Number of children: Not on file   Years of education: Not on file   Highest education level: Not on file  Occupational History   Not on file  Tobacco Use   Smoking status: Every Day   Smokeless tobacco: Never   Tobacco comments:    smokes a cigarette every now and then; denies smoking   Vaping Use   Vaping status: Never Used  Substance and Sexual Activity   Alcohol use: No   Drug use: No   Sexual activity: Not on file  Other Topics Concern   Not on file  Social History Narrative   Not on file   Social Drivers of Health   Financial Resource Strain: Not on file  Food Insecurity: Patient Declined (07/06/2023)   Hunger Vital Sign    Worried About Running Out of Food in the Last Year: Patient declined    Ran Out of Food in the Last Year: Patient declined  Transportation Needs: No Transportation Needs (07/06/2023)   PRAPARE - Administrator, Civil Service (Medical): No    Lack of Transportation (Non-Medical): No  Physical Activity: Not on file  Stress: Not on file  Social Connections: Unknown (11/30/2021)   Received from [facility]   Social Network    Social Network: Not on file  Intimate Partner Violence: Not At Risk (07/06/2023)   Humiliation, Afraid, Rape, and Kick questionnaire    Fear of Current or Ex-Partner: No    Emotionally Abused: No    Physically Abused: No    Sexually Abused: No    SDOH Interventions Today    Flowsheet Row Most Recent Value  SDOH Interventions   Food Insecurity Interventions Patient Declined  Housing Interventions Patient Declined  Transportation Interventions Intervention Not Indicated  Utilities Interventions Intervention Not Indicated        Health Maintenance  Topic Date Due   COVID-19 Vaccine (3 - 2024-25 season) 03/19/2023   Zoster (Shingles) Vaccine (1  of 2) 10/04/2023*   Flu Shot  10/16/2023*   Colon Cancer Screening  10/21/2024   DTaP/Tdap/Td vaccine (2 - Td or Tdap) 10/02/2029   Hepatitis C Screening  Completed   HIV Screening  Completed   HPV Vaccine  Aged Out  *Topic was postponed. The date shown is not the original due date.    Objective    BP (!) 137/97 (BP Location: Left Arm, Patient Position: Sitting, Cuff Size: Large)   Pulse (!) 103   Resp 16   Ht 6\' 1"  (1.854 m)   Wt 236 lb (107 kg)   SpO2 99%   BMI 31.14 kg/m  BP Readings from Last 3 Encounters:  07/06/23 (!) 137/97  05/25/23 122/79  05/20/23 (!) 124/96       Physical Exam Vitals reviewed.  Constitutional:      Appearance: He is obese.  HENT:     Head: Normocephalic.     Right Ear: Tympanic membrane and external ear normal.     Left Ear: Tympanic membrane and external ear normal.     Nose: Nose normal.  Eyes:     Extraocular Movements: Extraocular movements intact.     Pupils: Pupils are equal, round, and reactive to light.  Cardiovascular:     Rate and Rhythm: Normal rate and regular rhythm.  Pulmonary:     Effort: Pulmonary effort is normal.     Breath sounds: Normal breath sounds.  Abdominal:     General: Bowel sounds are normal. There is distension.     Palpations: Abdomen is soft.  Musculoskeletal:     Cervical back: Normal range of motion and neck supple.     Comments: LE weakness uses a quad cane for gait stability  Skin:    General: Skin is warm and dry.  Neurological:     Mental Status: He is oriented to person, place, and time.  Psychiatric:        Mood and Affect: Mood normal.        Behavior: Behavior normal.        Thought Content: Thought content normal.        Judgment: Judgment normal.      Assessment & Plan:  Nolyn was seen today for new patient (initial visit).  Diagnoses and all orders for this visit:  Encounter to establish care See HPI  Influenza vaccination declined  Herpes zoster vaccination  declined  Follow-up:  Return in about 3 months (around 10/04/2023) for re-check blood pressure.  The above assessment and management plan was discussed with the patient. The patient verbalized understanding of and has agreed to the management plan.  Patient is aware to call the clinic if symptoms fail to improve or worsen. Patient is aware when to return to the clinic for a follow-up visit. Patient educated on when it is appropriate to go to the emergency department.   Gwinda Passe, NP-C

## 2023-07-15 ENCOUNTER — Other Ambulatory Visit: Payer: Self-pay | Admitting: Nurse Practitioner

## 2023-07-15 DIAGNOSIS — M2550 Pain in unspecified joint: Secondary | ICD-10-CM

## 2023-07-20 DIAGNOSIS — Z6832 Body mass index (BMI) 32.0-32.9, adult: Secondary | ICD-10-CM | POA: Diagnosis not present

## 2023-07-20 DIAGNOSIS — M0579 Rheumatoid arthritis with rheumatoid factor of multiple sites without organ or systems involvement: Secondary | ICD-10-CM | POA: Diagnosis not present

## 2023-07-20 DIAGNOSIS — E669 Obesity, unspecified: Secondary | ICD-10-CM | POA: Diagnosis not present

## 2023-07-20 DIAGNOSIS — R5382 Chronic fatigue, unspecified: Secondary | ICD-10-CM | POA: Diagnosis not present

## 2023-07-20 DIAGNOSIS — M25561 Pain in right knee: Secondary | ICD-10-CM | POA: Diagnosis not present

## 2023-07-20 DIAGNOSIS — M1991 Primary osteoarthritis, unspecified site: Secondary | ICD-10-CM | POA: Diagnosis not present

## 2023-08-17 DIAGNOSIS — M25561 Pain in right knee: Secondary | ICD-10-CM | POA: Diagnosis not present

## 2023-08-17 DIAGNOSIS — R5382 Chronic fatigue, unspecified: Secondary | ICD-10-CM | POA: Diagnosis not present

## 2023-08-17 DIAGNOSIS — Z6831 Body mass index (BMI) 31.0-31.9, adult: Secondary | ICD-10-CM | POA: Diagnosis not present

## 2023-08-17 DIAGNOSIS — M1991 Primary osteoarthritis, unspecified site: Secondary | ICD-10-CM | POA: Diagnosis not present

## 2023-08-17 DIAGNOSIS — M0579 Rheumatoid arthritis with rheumatoid factor of multiple sites without organ or systems involvement: Secondary | ICD-10-CM | POA: Diagnosis not present

## 2023-08-17 DIAGNOSIS — E669 Obesity, unspecified: Secondary | ICD-10-CM | POA: Diagnosis not present

## 2023-09-07 DIAGNOSIS — M25561 Pain in right knee: Secondary | ICD-10-CM | POA: Diagnosis not present

## 2023-10-23 DIAGNOSIS — M25561 Pain in right knee: Secondary | ICD-10-CM | POA: Diagnosis not present

## 2023-10-23 DIAGNOSIS — E669 Obesity, unspecified: Secondary | ICD-10-CM | POA: Diagnosis not present

## 2023-10-23 DIAGNOSIS — R5382 Chronic fatigue, unspecified: Secondary | ICD-10-CM | POA: Diagnosis not present

## 2023-10-23 DIAGNOSIS — Z6831 Body mass index (BMI) 31.0-31.9, adult: Secondary | ICD-10-CM | POA: Diagnosis not present

## 2023-10-23 DIAGNOSIS — M1991 Primary osteoarthritis, unspecified site: Secondary | ICD-10-CM | POA: Diagnosis not present

## 2023-10-23 DIAGNOSIS — M0579 Rheumatoid arthritis with rheumatoid factor of multiple sites without organ or systems involvement: Secondary | ICD-10-CM | POA: Diagnosis not present

## 2023-10-30 DIAGNOSIS — M0579 Rheumatoid arthritis with rheumatoid factor of multiple sites without organ or systems involvement: Secondary | ICD-10-CM | POA: Diagnosis not present

## 2023-11-27 DIAGNOSIS — M0579 Rheumatoid arthritis with rheumatoid factor of multiple sites without organ or systems involvement: Secondary | ICD-10-CM | POA: Diagnosis not present

## 2023-12-21 DIAGNOSIS — R5382 Chronic fatigue, unspecified: Secondary | ICD-10-CM | POA: Diagnosis not present

## 2023-12-21 DIAGNOSIS — M25561 Pain in right knee: Secondary | ICD-10-CM | POA: Diagnosis not present

## 2023-12-21 DIAGNOSIS — M0579 Rheumatoid arthritis with rheumatoid factor of multiple sites without organ or systems involvement: Secondary | ICD-10-CM | POA: Diagnosis not present

## 2023-12-21 DIAGNOSIS — E669 Obesity, unspecified: Secondary | ICD-10-CM | POA: Diagnosis not present

## 2023-12-21 DIAGNOSIS — Z6830 Body mass index (BMI) 30.0-30.9, adult: Secondary | ICD-10-CM | POA: Diagnosis not present

## 2023-12-21 DIAGNOSIS — M1991 Primary osteoarthritis, unspecified site: Secondary | ICD-10-CM | POA: Diagnosis not present

## 2024-01-22 DIAGNOSIS — M0579 Rheumatoid arthritis with rheumatoid factor of multiple sites without organ or systems involvement: Secondary | ICD-10-CM | POA: Diagnosis not present
